# Patient Record
Sex: Male | Born: 1985
Health system: Southern US, Community
[De-identification: ages and names within clinical notes are randomized; demographics above are authoritative.]

## PROBLEM LIST (undated history)

## (undated) DIAGNOSIS — S62339A Displaced fracture of neck of unspecified metacarpal bone, initial encounter for closed fracture: Secondary | ICD-10-CM

## (undated) DIAGNOSIS — J3489 Other specified disorders of nose and nasal sinuses: Secondary | ICD-10-CM

## (undated) HISTORY — PX: NO PAST SURGERIES: SHX2092

---
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
## 2007-09-13 ENCOUNTER — Emergency Department (HOSPITAL_COMMUNITY): Admission: EM | Admit: 2007-09-13 | Discharge: 2007-09-13 | Payer: Self-pay | Admitting: Emergency Medicine

## 2010-08-13 ENCOUNTER — Inpatient Hospital Stay (INDEPENDENT_AMBULATORY_CARE_PROVIDER_SITE_OTHER)
Admission: RE | Admit: 2010-08-13 | Discharge: 2010-08-13 | Disposition: A | Discharge status: Home / Self Care | Payer: Self-pay | Source: Ambulatory Visit | Attending: Emergency Medicine | Admitting: Emergency Medicine

## 2010-08-13 DIAGNOSIS — R112 Nausea with vomiting, unspecified: Secondary | ICD-10-CM

## 2010-12-23 LAB — CBC
HCT: 47
Platelets: 265
RBC: 5.23
WBC: 12.8 — ABNORMAL HIGH

## 2010-12-23 LAB — RAPID URINE DRUG SCREEN, HOSP PERFORMED
Amphetamines: NOT DETECTED
Barbiturates: NOT DETECTED
Benzodiazepines: NOT DETECTED
Opiates: NOT DETECTED
Tetrahydrocannabinol: NOT DETECTED

## 2010-12-23 LAB — BASIC METABOLIC PANEL
BUN: 7
Chloride: 106
Creatinine, Ser: 1.14
GFR calc Af Amer: >60
GFR calc non Af Amer: >60
Potassium: 3.7

## 2010-12-23 LAB — DIFFERENTIAL
Lymphocytes Relative: 13
Lymphs Abs: 1.6
Monocytes Relative: 2 — ABNORMAL LOW
Neutrophils Relative %: 85 — ABNORMAL HIGH

## 2010-12-23 LAB — ETHANOL: Alcohol, Ethyl (B): 73 — ABNORMAL HIGH

## 2011-07-25 ENCOUNTER — Emergency Department (INDEPENDENT_AMBULATORY_CARE_PROVIDER_SITE_OTHER)
Admission: EM | Admit: 2011-07-25 | Discharge: 2011-07-25 | Disposition: A | Discharge status: Home Health | Payer: Self-pay | Source: Home / Self Care | Attending: Family Medicine | Admitting: Family Medicine

## 2011-07-25 ENCOUNTER — Encounter (HOSPITAL_COMMUNITY): Payer: Self-pay

## 2011-07-25 DIAGNOSIS — M539 Dorsopathy, unspecified: Secondary | ICD-10-CM

## 2011-07-25 DIAGNOSIS — M62830 Muscle spasm of back: Secondary | ICD-10-CM

## 2011-07-25 MED ORDER — NAPROXEN 500 MG PO TABS: 500.0000 mg | ORAL_TABLET | Freq: Two times a day (BID) | ORAL | Status: AC

## 2011-07-25 MED ORDER — KETOROLAC TROMETHAMINE 60 MG/2ML IM SOLN
60.0000 mg | Freq: Once | INTRAMUSCULAR | Status: AC
  Administered 2011-07-25: 60 mg via INTRAMUSCULAR

## 2011-07-25 MED ORDER — DIAZEPAM 5 MG PO TABS: 5.0000 mg | ORAL_TABLET | Freq: Three times a day (TID) | ORAL | Status: AC | PRN

## 2011-07-25 MED ORDER — PROMETHAZINE-DM 6.25-15 MG/5ML PO SYRP: 5.0000 mL | ORAL_SOLUTION | Freq: Four times a day (QID) | ORAL | Status: AC | PRN

## 2011-07-25 MED ORDER — HYDROCODONE-ACETAMINOPHEN 5-325 MG PO TABS
ORAL_TABLET | ORAL | Status: AC
  Filled 2011-07-25: qty 1

## 2011-07-25 MED ORDER — KETOROLAC TROMETHAMINE 60 MG/2ML IM SOLN
INTRAMUSCULAR | Status: AC
  Filled 2011-07-25: qty 2

## 2011-07-25 MED ORDER — HYDROCODONE-ACETAMINOPHEN 5-325 MG PO TABS
1.0000 | ORAL_TABLET | Freq: Once | ORAL | Status: AC
  Administered 2011-07-25: 1 via ORAL

## 2011-07-25 NOTE — ED Notes (Signed)
Pt c/o L lower back pain onset this weekend.  Pt states he was at a "get together", he twisted and fell to floor.  Pt states he has been using Tylenol, Crista Elliot and Kahi Mohala with no relief.
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
## 2011-07-25 NOTE — Discharge Instructions (Signed)
Take the prescribed medications as directed. Take the naproxen as directed, with meals to avoid stomach irritation, daily for baseline pain relief. Use the diazepam (VALIUM) ONLY AS NEEDED and AS DIRECTED, for muscle spasm. Do not mix with alcohol or any other sedating drug. Use mild heat (heating pad, warm baths, etc) for 10 to 15 minutes, two to three times daily, as needed and as tolerated, taking care to not burn the skin. Begin stretches and exercises, as instructed in handouts, after 48 hours. Return to care should your symptoms not improve, or worsen in any way, such as numbness, weakness, or tingling, or inability to control urine or bowel movements.

## 2014-08-23 ENCOUNTER — Encounter (HOSPITAL_COMMUNITY): Payer: Self-pay | Admitting: Emergency Medicine

## 2014-08-23 ENCOUNTER — Emergency Department (HOSPITAL_COMMUNITY)
Admission: EM | Admit: 2014-08-23 | Discharge: 2014-08-23 | Disposition: A | Discharge status: Home / Self Care | Payer: Self-pay | Attending: Emergency Medicine | Admitting: Emergency Medicine

## 2014-08-23 ENCOUNTER — Emergency Department (HOSPITAL_COMMUNITY): Payer: Self-pay

## 2014-08-23 DIAGNOSIS — R05 Cough: Secondary | ICD-10-CM | POA: Insufficient documentation

## 2014-08-23 DIAGNOSIS — R059 Cough, unspecified: Secondary | ICD-10-CM

## 2014-08-23 DIAGNOSIS — Z720 Tobacco use: Secondary | ICD-10-CM | POA: Insufficient documentation

## 2014-08-23 DIAGNOSIS — J3489 Other specified disorders of nose and nasal sinuses: Secondary | ICD-10-CM | POA: Insufficient documentation

## 2014-08-23 DIAGNOSIS — R0981 Nasal congestion: Secondary | ICD-10-CM | POA: Insufficient documentation

## 2014-08-23 DIAGNOSIS — M791 Myalgia: Secondary | ICD-10-CM | POA: Insufficient documentation

## 2014-08-23 MED ORDER — PROMETHAZINE-CODEINE 6.25-10 MG/5ML PO SYRP: 5.0000 mL | ORAL_SOLUTION | Freq: Four times a day (QID) | ORAL | Status: DC | PRN

## 2014-08-23 NOTE — Discharge Instructions (Signed)
Cough, Adult  A cough is a reflex that helps clear your throat and airways. It can help heal the body or may be a reaction to an irritated airway. A cough may only last 2 or 3 weeks (acute) or may last more than 8 weeks (chronic).  CAUSES Acute cough:  Viral or bacterial infections. Chronic cough:  Infections.  Allergies.  Asthma.  Post-nasal drip.  Smoking.  Heartburn or acid reflux.  Some medicines.  Chronic lung problems (COPD).  Cancer. SYMPTOMS   Cough.  Fever.  Chest pain.  Increased breathing rate.  High-pitched whistling sound when breathing (wheezing).  Colored mucus that you cough up (sputum). TREATMENT   A bacterial cough may be treated with antibiotic medicine.  A viral cough must run its course and will not respond to antibiotics.  Your caregiver may recommend other treatments if you have a chronic cough. HOME CARE INSTRUCTIONS   Only take over-the-counter or prescription medicines for pain, discomfort, or fever as directed by your caregiver. Use cough suppressants only as directed by your caregiver.  Use a cold steam vaporizer or humidifier in your bedroom or home to help loosen secretions.  Sleep in a semi-upright position if your cough is worse at night.  Rest as needed.  Stop smoking if you smoke. SEEK IMMEDIATE MEDICAL CARE IF:   You have pus in your sputum.  Your cough starts to worsen.  You cannot control your cough with suppressants and are losing sleep.  You begin coughing up blood.  You have difficulty breathing.  You develop pain which is getting worse or is uncontrolled with medicine.  You have a fever. MAKE SURE YOU:   Understand these instructions.  Will watch your condition.  Will get help right away if you are not doing well or get worse. Document Released: 09/10/2010 Document Revised: 06/06/2011 Document Reviewed: 09/10/2010 [facility] Patient Information 2015 [place], [place]. This information is not intended  to replace advice given to you by your health care provider. Make sure you discuss any questions you have with your health care provider.    Cervical Sprain A cervical sprain is an injury in the neck in which the strong, fibrous tissues (ligaments) that connect your neck bones stretch or tear. Cervical sprains can range from mild to severe. Severe cervical sprains can cause the neck vertebrae to be unstable. This can lead to damage of the spinal cord and can result in serious nervous system problems. The amount of time it takes for a cervical sprain to get better depends on the cause and extent of the injury. Most cervical sprains heal in 1 to 3 weeks. CAUSES  Severe cervical sprains may be caused by:   Contact sport injuries (such as from football, rugby, wrestling, hockey, auto racing, gymnastics, diving, martial arts, or boxing).   Motor vehicle collisions.   Whiplash injuries. This is an injury from a sudden forward and backward whipping movement of the head and neck.  Falls.  Mild cervical sprains may be caused by:   Being in an awkward position, such as while cradling a telephone between your ear and shoulder.   Sitting in a chair that does not offer proper support.   Working at a poorly Marketing executive station.   Looking up or down for long periods of time.  SYMPTOMS   Pain, soreness, stiffness, or a burning sensation in the front, back, or sides of the neck. This discomfort may develop immediately after the injury or slowly, 24 hours or more  after the injury.   Pain or tenderness directly in the middle of the back of the neck.   Shoulder or upper back pain.   Limited ability to move the neck.   Headache.   Dizziness.   Weakness, numbness, or tingling in the hands or arms.   Muscle spasms.   Difficulty swallowing or chewing.   Tenderness and swelling of the neck.  DIAGNOSIS  Most of the time your health care provider can diagnose a cervical  sprain by taking your history and doing a physical exam. Your health care provider will ask about previous neck injuries and any known neck problems, such as arthritis in the neck. X-rays may be taken to find out if there are any other problems, such as with the bones of the neck. Other tests, such as a CT scan or MRI, may also be needed.  TREATMENT  Treatment depends on the severity of the cervical sprain. Mild sprains can be treated with rest, keeping the neck in place (immobilization), and pain medicines. Severe cervical sprains are immediately immobilized. Further treatment is done to help with pain, muscle spasms, and other symptoms and may include:  Medicines, such as pain relievers, numbing medicines, or muscle relaxants.   Physical therapy. This may involve stretching exercises, strengthening exercises, and posture training. Exercises and improved posture can help stabilize the neck, strengthen muscles, and help stop symptoms from returning.  HOME CARE INSTRUCTIONS   Put ice on the injured area.   Put ice in a plastic bag.   Place a towel between your skin and the bag.   Leave the ice on for 15-20 minutes, 3-4 times a day.   If your injury was severe, you may have been given a cervical collar to wear. A cervical collar is a two-piece collar designed to keep your neck from moving while it heals.  Do not remove the collar unless instructed by your health care provider.  If you have long hair, keep it outside of the collar.  Ask your health care provider before making any adjustments to your collar. Minor adjustments may be required over time to improve comfort and reduce pressure on your chin or on the back of your head.  Ifyou are allowed to remove the collar for cleaning or bathing, follow your health care provider's instructions on how to do so safely.  Keep your collar clean by wiping it with mild soap and water and drying it completely. If the collar you have been given  includes removable pads, remove them every 1-2 days and hand wash them with soap and water. Allow them to air dry. They should be completely dry before you wear them in the collar.  If you are allowed to remove the collar for cleaning and bathing, wash and dry the skin of your neck. Check your skin for irritation or sores. If you see any, tell your health care provider.  Do not drive while wearing the collar.   Only take over-the-counter or prescription medicines for pain, discomfort, or fever as directed by your health care provider.   Keep all follow-up appointments as directed by your health care provider.   Keep all physical therapy appointments as directed by your health care provider.   Make any needed adjustments to your workstation to promote good posture.   Avoid positions and activities that make your symptoms worse.   Warm up and stretch before being active to help prevent problems.  SEEK MEDICAL CARE IF:   Your pain  is not controlled with medicine.   You are unable to decrease your pain medicine over time as planned.   Your activity level is not improving as expected.  SEEK IMMEDIATE MEDICAL CARE IF:   You develop any bleeding.  You develop stomach upset.  You have signs of an allergic reaction to your medicine.   Your symptoms get worse.   You develop new, unexplained symptoms.   You have numbness, tingling, weakness, or paralysis in any part of your body.  MAKE SURE YOU:   Understand these instructions.  Will watch your condition.  Will get help right away if you are not doing well or get worse. Document Released: 01/09/2007 Document Revised: 03/19/2013 Document Reviewed: 09/19/2012 [facility] Patient Information 2015 [place], [place]. This information is not intended to replace advice given to you by your health care provider. Make sure you discuss any questions you have with your health care provider.

## 2014-08-23 NOTE — ED Provider Notes (Signed)
CSN: [id number]     Arrival date & time 08/23/14  0607 History   First MD Initiated Contact with Patient 08/23/14 [phone]     Chief Complaint  Patient presents with  . Shortness of Breath     (Consider location/radiation/quality/duration/timing/severity/associated sxs/prior Treatment) HPI  29 year old male presents with a cough for over one week. He states his breathing is "heavy". He has a history of "chronic bronchitis" the states his previous PCP treated him with Phenergan with codeine syrup. He states he last had some over a week ago. He chronically gets recurrent rhinorrhea and mixed dry and productive cough and states that usually this cough medicine is nothing that helps. He denies chest pain. Patient states that he lost his insurance and is unable to follow up with his doctor anymore. He was having significant cough last night and can sleep so his mom recommended he come to the ER. No chest pain. The patient is a current smoker. The patient also states he is having a crick in the left side of his neck after sleeping on it wrong 2 days ago. Has tried topical medicine and ibuprofen without any relief.  History reviewed. No pertinent past medical history. History reviewed. No pertinent past surgical history. History reviewed. No pertinent family history. History  Substance Use Topics  . Smoking status: Current Some Day Smoker    Types: Cigarettes  . Smokeless tobacco: Not on file  . Alcohol Use: Yes    Review of Systems  Constitutional: Negative for fever.  HENT: Positive for congestion and rhinorrhea.   Respiratory: Positive for cough and shortness of breath.   Cardiovascular: Negative for chest pain.  Gastrointestinal: Negative for vomiting.  Musculoskeletal: Positive for myalgias.  All other systems reviewed and are negative.     Allergies  Review of patient's allergies indicates no known allergies.  Home Medications   Prior to Admission medications   Medication Sig  Start Date End Date Taking? Authorizing Provider  promethazine-codeine (PHENERGAN WITH CODEINE) 6.25-10 MG/5ML syrup Take 5 mLs by mouth every 6 (six) hours as needed for cough.   Yes Historical Provider, MD   BP 127/80 mmHg  Pulse 77  Temp(Src) 98.3 F (36.8 C) (Oral)  Resp 22  Ht  (1.702 m)  Wt 170 lb (77.111 kg)  BMI 26.62 kg/m2  SpO2 100% Physical Exam  Constitutional: He is oriented to person, place, and time. He appears well-developed and well-nourished.  HENT:  Head: Normocephalic and atraumatic.  Right Ear: External ear normal.  Left Ear: External ear normal.  Nose: Nose normal.  Eyes: Right eye exhibits no discharge. Left eye exhibits no discharge.  Neck: Normal range of motion. Neck supple. Muscular tenderness (mild) present. No spinous process tenderness present. No rigidity.    Cardiovascular: Normal rate, regular rhythm, normal heart sounds and intact distal pulses.   Pulmonary/Chest: Effort normal and breath sounds normal. He has no wheezes. He has no rales.  Abdominal: Soft. There is no tenderness.  Musculoskeletal: He exhibits no edema.  Neurological: He is alert and oriented to person, place, and time.  Skin: Skin is warm and dry.  Nursing note and vitals reviewed.   ED Course  Procedures (including critical care time) Labs Review Labs Reviewed - No data to display  Imaging Review Dg Chest 2 View  08/23/2014   CLINICAL DATA:  Shortness of breath and left-sided neck pain  EXAM: CHEST  2 VIEW  COMPARISON:  None.  FINDINGS: The heart size and mediastinal contours  are within normal limits. Both lungs are clear. The visualized skeletal structures are unremarkable.  IMPRESSION: No active cardiopulmonary disease.   Electronically Signed   By: Christiana PellantGretchen  Green M.D.   On: 08/23/2014 08:07     EKG Interpretation None      MDM   Final diagnoses:  Cough    Patient is well-appearing and in no acute distress. Vital signs are unremarkable and he has a normal  physical exam at this time. Chest x-ray is normal, have extremely low suspicion for other serious diagnosis such as pulmonary embolism causing cough. His neck pain appears to be musculoskeletal. Will give small amount of his cough syrup but have discussed the importance of following up with a primary care physician given his chronic cough. Discussed need to stop smoking as well.    Pricilla LovelessScott Gaelyn Tukes, MD 08/23/14 [phone]

## 2014-08-23 NOTE — ED Notes (Signed)
Pt arrived to the ED with a complaint of shortness of breath.  Pt states he has chronic bronchitis.  Pt has had this condition for four years.  Pt states today he felt that he was worsening.  Pt states he was on medications previously but has not seen his doctor in a while.

## 2014-12-04 ENCOUNTER — Emergency Department (HOSPITAL_COMMUNITY)
Admission: EM | Admit: 2014-12-04 | Discharge: 2014-12-04 | Disposition: A | Discharge status: Home / Self Care | Payer: Self-pay | Attending: Emergency Medicine | Admitting: Emergency Medicine

## 2014-12-04 ENCOUNTER — Ambulatory Visit: Payer: Self-pay

## 2014-12-04 ENCOUNTER — Encounter (HOSPITAL_COMMUNITY): Payer: Self-pay | Admitting: Emergency Medicine

## 2014-12-04 ENCOUNTER — Emergency Department (HOSPITAL_COMMUNITY)
Admission: EM | Admit: 2014-12-04 | Discharge: 2014-12-04 | Discharge status: Against Medical Advice | Payer: Self-pay | Attending: Emergency Medicine | Admitting: Emergency Medicine

## 2014-12-04 DIAGNOSIS — Z8709 Personal history of other diseases of the respiratory system: Secondary | ICD-10-CM | POA: Insufficient documentation

## 2014-12-04 DIAGNOSIS — R05 Cough: Secondary | ICD-10-CM | POA: Insufficient documentation

## 2014-12-04 DIAGNOSIS — Z7289 Other problems related to lifestyle: Secondary | ICD-10-CM | POA: Insufficient documentation

## 2014-12-04 DIAGNOSIS — Z765 Malingerer [conscious simulation]: Secondary | ICD-10-CM

## 2014-12-04 DIAGNOSIS — R059 Cough, unspecified: Secondary | ICD-10-CM

## 2014-12-04 DIAGNOSIS — J029 Acute pharyngitis, unspecified: Secondary | ICD-10-CM | POA: Insufficient documentation

## 2014-12-04 DIAGNOSIS — Z720 Tobacco use: Secondary | ICD-10-CM | POA: Insufficient documentation

## 2014-12-04 MED ORDER — PROMETHAZINE-CODEINE 6.25-10 MG/5ML PO SYRP: 5.0000 mL | ORAL_SOLUTION | Freq: Four times a day (QID) | ORAL | Status: DC | PRN

## 2014-12-04 NOTE — ED Provider Notes (Signed)
CSN: [id number]     Arrival date & time 12/04/14  0935 History  This chart was scribed for non-physician practitioner, Burna Forts, PA-C working with No att. providers found by Angelene Giovanni, ED Scribe. The patient was seen in room TR11C/TR11C and the patient's care was started at 10:50 AM    Chief Complaint  Patient presents with  . Cough   The history is provided by the patient. No language interpreter was used.   HPI Comments:  Drug-seeking behavior   Jeff Manning is a 29 y.o. male with a hx of Bronchitis who presents to the Emergency Department complaining of gradually worsening cough that is worse at night onset 3 days ago. He reports associated rhinorrhea, watery eyes, and mild sore throat. He denies any leg swelling or a hx of PE or DVT. He states that he is a smoker. He denies any allergies. He reports that he has not been able to go to work for the past 3 days because he has not been able to sleep at night. He states that he was seen by his PCP, Dr. Donnie Coffin on [facility] yesterday and he told him that there is Phlegm building up in his chest after his chest X-ray but he would not prescribe him anything since he did not have his medicaid card. He adds that normally he is prescribed Phenergan and he takes it at night. He states that his PCP asked him to come to the ER to be able to be seen and prescribed medication despite not having his card. 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    Past Medical History  Diagnosis Date  . Bronchitis    History reviewed. No pertinent past surgical history. History reviewed. No pertinent family history. Social History  Substance Use Topics  . Smoking status: Current Some Day Smoker    Types: Cigarettes  . Smokeless tobacco: None  . Alcohol Use: Yes    Review of Systems  All other systems reviewed and are negative.   Allergies  Review of patient's allergies indicates no known allergies.  Home Medications   Prior to Admission  medications   Medication Sig Start Date End Date Taking? Authorizing Provider  promethazine-codeine (PHENERGAN WITH CODEINE) 6.25-10 MG/5ML syrup Take 5 mLs by mouth every 6 (six) hours as needed for cough. 12/04/14   Ambrose Finland Little, MD   BP 114/82 mmHg  Pulse 65  Temp(Src) 98.2 F (36.8 C) (Oral)  Resp 18  Ht 5\' 6"  (1.676 m)  Wt 185 lb (83.915 kg)  BMI 29.87 kg/m2  SpO2 99%   Physical Exam  Constitutional: He is oriented to person, place, and time. He appears well-developed and well-nourished.  HENT:  Head: Normocephalic and atraumatic.  Cardiovascular: Normal rate and regular rhythm.   Pulmonary/Chest: Effort normal. No respiratory distress. He has no wheezes. He has no rales. He exhibits no tenderness.  Abdominal: He exhibits no distension. There is no tenderness.  Musculoskeletal: Normal range of motion. He exhibits no edema or tenderness.  Neurological: He is alert and oriented to person, place, and time.  Skin: Skin is warm and dry.  Psychiatric: He has a normal mood and affect.  Nursing note and vitals reviewed.   ED Course  Procedures (including critical care time) DIAGNOSTIC STUDIES: Oxygen Saturation is 99% on RA, normal by my interpretation.    COORDINATION OF CARE: 10:58 AM- Pt advised of plan for treatment and pt agrees.    Labs Review Labs  Reviewed - No data to display  Imaging Review No results found. I have personally reviewed and evaluated these images and lab results as part of my medical decision-making.   EKG Interpretation None      MDM   Final diagnoses:  Drug-seeking behavior   Drug-seeking   Labs:  Imaging:  Consults:  Therapeutics:  Discharge Meds:   Patient presented to the emergency room with complaints of "chronic bronchitis". He reports that he saw his primary care provider yesterday who was unwilling to prescribe his cough medication due to him not having a Medicaid card. Patient reports that it is chest x-ray with no  significant findings. Patient reports the symptoms come and go, he has difficulty sleeping, has not been able to work for the last 3 days. After evaluating the patient and finding no findings on my exam I informed him that he does not need any medications at this time, and that he needs to contact his primary care provider if they instructed him to have cough medication. I went and reviewed patient's chart and saw that he was seen in the emergency room today at [facility] was prescribed cough medicine with codeine. I asked the patient about this encounter, he admitted to being seen there and reported that "they told me to come to the emergency room here". I told the patient this is highly unlikely as I read the note and third no indications for you to return to the emergency room, patient was unable to tell me why his discomfort emergency room. Patient asked to the bathroom, after the bathroom he eloped from the facility. Obviously drug-seeking behavior.   I personally performed the services described in this documentation, which was scribed in my presence. The recorded information has been reviewed and is accurate.        Eyvonne Mechanic, PA-C 12/04/14 1728  Bethann Berkshire, MD 12/05/14 [phone]

## 2014-12-04 NOTE — ED Notes (Addendum)
Pt states he usually get bronchitis once a year when he gets a cold and is having trouble sleeping at night; occasional smoker. Hx of such and called MD and he was px phenergan to be able to sleep at night. Chest pain centralized when coughs, deep inspiration, crosses arms. States always has chest pain when sick with bronchitis.

## 2014-12-04 NOTE — ED Provider Notes (Signed)
CSN: [id number]     Arrival date & time 12/04/14  9562 History   First MD Initiated Contact with Patient 12/04/14 0654     Chief Complaint  Patient presents with  . Bronchitis     (Consider location/radiation/quality/duration/timing/severity/associated sxs/prior Treatment) HPI Comments: 29yo healthy M who presents with cough. The patient states that 11/2-2 weeks ago, he developed cold-like symptoms including cough, sore throat, runny nose. After several days old his symptoms resolved with the exception of his cough, which is persistent and worse at night when he is trying to sleep. He states that he gets bronchitis approximately once a year and tried to see his PCP to get cough suppressant medication but was unable to locate his Medicaid card. He presents this morning requesting refill of this medication. He has some chest pain when he coughs but denies any chest pain at rest. No shortness of breath. No vomiting, diarrhea, or abdominal pain.  The history is provided by the patient.    Past Medical History  Diagnosis Date  . Bronchitis    History reviewed. No pertinent past surgical history. No family history on file. Social History  Substance Use Topics  . Smoking status: Current Some Day Smoker    Types: Cigarettes  . Smokeless tobacco: None  . Alcohol Use: Yes    Review of Systems  10 Systems reviewed and are negative for acute change except as noted in the HPI.   Allergies  Review of patient's allergies indicates no known allergies.  Home Medications   Prior to Admission medications   Medication Sig Start Date End Date Taking? Authorizing Provider  promethazine-codeine (PHENERGAN WITH CODEINE) 6.25-10 MG/5ML syrup Take 5 mLs by mouth every 6 (six) hours as needed for cough. 12/04/14   Ambrose Finland Little, MD   BP 135/109 mmHg  Pulse 59  Temp(Src) 97.8 F (36.6 C) (Oral)  Resp 18  Ht  (1.676 m)  Wt 185 lb (83.915 kg)  BMI 29.87 kg/m2  SpO2 99% Physical Exam   Constitutional: He is oriented to person, place, and time. He appears well-developed and well-nourished. No distress.  HENT:  Head: Normocephalic and atraumatic.  Mouth/Throat: Oropharynx is clear and moist.  Moist mucous membranes; large tonsils  Eyes: Conjunctivae are normal. Pupils are equal, round, and reactive to light.  Neck: Neck supple.  Cardiovascular: Normal rate, regular rhythm and normal heart sounds.   No murmur heard. Pulmonary/Chest: Effort normal and breath sounds normal. No respiratory distress. He has no wheezes.  Occasional cough w/ deep inspiration  Abdominal: Soft. Bowel sounds are normal. He exhibits no distension. There is no tenderness.  Musculoskeletal: He exhibits no edema.  Neurological: He is alert and oriented to person, place, and time.  Fluent speech  Skin: Skin is warm and dry.  Psychiatric: He has a normal mood and affect. Judgment normal.  Nursing note and vitals reviewed.   ED Course  Procedures (including critical care time) Labs Review Labs Reviewed - No data to display   MDM   Final diagnoses:  Cough   29 year old male who presents with several weeks of cough after cold-like illness. Patient well-appearing with normal vital signs at presentation. No reports of fever or shortness of breath therefore I do not feel that he needs any imaging at this time. He reports a chronic history of enlarged tonsils and denies any sore throat currently. I have instructed him to follow-up with PCP for ENT referral as needed. Regarding cough, provided phenergan w/ codeine for  use at night and cautioned on any use during day while working, driving etc. Return precautions including fever, SOB reviewed and patient voiced understanding. Pt discharged in satisfactory condition.  Laurence Spates, MD 12/04/14 [phone]

## 2014-12-04 NOTE — ED Notes (Signed)
Pt c/o cough, difficulty breathing at night. Pt states he gets bronchitis every year at this time and is given promethazine syrup

## 2014-12-04 NOTE — Discharge Instructions (Signed)
Cough, Adult  A cough is a reflex that helps clear your throat and airways. It can help heal the body or may be a reaction to an irritated airway. A cough may only last 2 or 3 weeks (acute) or may last more than 8 weeks (chronic).  CAUSES Acute cough:  Viral or bacterial infections. Chronic cough:  Infections.  Allergies.  Asthma.  Post-nasal drip.  Smoking.  Heartburn or acid reflux.  Some medicines.  Chronic lung problems (COPD).  Cancer. SYMPTOMS   Cough.  Fever.  Chest pain.  Increased breathing rate.  High-pitched whistling sound when breathing (wheezing).  Colored mucus that you cough up (sputum). TREATMENT   A bacterial cough may be treated with antibiotic medicine.  A viral cough must run its course and will not respond to antibiotics.  Your caregiver may recommend other treatments if you have a chronic cough. HOME CARE INSTRUCTIONS   Only take over-the-counter or prescription medicines for pain, discomfort, or fever as directed by your caregiver. Use cough suppressants only as directed by your caregiver.  Use a cold steam vaporizer or humidifier in your bedroom or home to help loosen secretions.  Sleep in a semi-upright position if your cough is worse at night.  Rest as needed.  Stop smoking if you smoke. SEEK IMMEDIATE MEDICAL CARE IF:   You have pus in your sputum.  Your cough starts to worsen.  You cannot control your cough with suppressants and are losing sleep.  You begin coughing up blood.  You have difficulty breathing.  You develop pain which is getting worse or is uncontrolled with medicine.  You have a fever. MAKE SURE YOU:   Understand these instructions.  Will watch your condition.  Will get help right away if you are not doing well or get worse. Document Released: 09/10/2010 Document Revised: 06/06/2011 Document Reviewed: 09/10/2010 Exit[employer] Patient Information 2015 [employer]. This information is not intended  to replace advice given to you by your health care provider. Make sure you discuss any questions you have with your health care provider.  

## 2014-12-04 NOTE — ED Notes (Signed)
PT reports a fever at night . Pt also reports he takes Phenergan for cough. Pt reports his Med coverage  And can not see his PCP.

## 2015-03-11 ENCOUNTER — Encounter (HOSPITAL_BASED_OUTPATIENT_CLINIC_OR_DEPARTMENT_OTHER): Payer: Self-pay

## 2015-03-11 ENCOUNTER — Emergency Department (HOSPITAL_BASED_OUTPATIENT_CLINIC_OR_DEPARTMENT_OTHER): Payer: Self-pay

## 2015-03-11 ENCOUNTER — Emergency Department (HOSPITAL_BASED_OUTPATIENT_CLINIC_OR_DEPARTMENT_OTHER)
Admission: EM | Admit: 2015-03-11 | Discharge: 2015-03-11 | Disposition: A | Discharge status: Home / Self Care | Payer: Self-pay | Attending: Emergency Medicine | Admitting: Emergency Medicine

## 2015-03-11 DIAGNOSIS — R0602 Shortness of breath: Secondary | ICD-10-CM

## 2015-03-11 DIAGNOSIS — J069 Acute upper respiratory infection, unspecified: Secondary | ICD-10-CM | POA: Insufficient documentation

## 2015-03-11 DIAGNOSIS — F17210 Nicotine dependence, cigarettes, uncomplicated: Secondary | ICD-10-CM | POA: Insufficient documentation

## 2015-03-11 LAB — RAPID STREP SCREEN (MED CTR MEBANE ONLY): Streptococcus, Group A Screen (Direct): NEGATIVE

## 2015-03-11 MED ORDER — ALBUTEROL SULFATE HFA 108 (90 BASE) MCG/ACT IN AERS: 2.0000 | INHALATION_SPRAY | RESPIRATORY_TRACT | Status: DC | PRN

## 2015-03-11 MED ORDER — ALBUTEROL SULFATE HFA 108 (90 BASE) MCG/ACT IN AERS
2.0000 | INHALATION_SPRAY | Freq: Once | RESPIRATORY_TRACT | Status: AC
  Administered 2015-03-11: 2 via RESPIRATORY_TRACT

## 2015-03-11 MED ORDER — ALBUTEROL SULFATE HFA 108 (90 BASE) MCG/ACT IN AERS
INHALATION_SPRAY | RESPIRATORY_TRACT | Status: AC
  Filled 2015-03-11: qty 6.7

## 2015-03-11 MED ORDER — BENZONATATE 100 MG PO CAPS: 100.0000 mg | ORAL_CAPSULE | Freq: Three times a day (TID) | ORAL | Status: DC

## 2015-03-11 NOTE — Discharge Instructions (Signed)

## 2015-03-11 NOTE — ED Provider Notes (Signed)
CSN: [id number]     Arrival date & time 03/11/15  0131 History   None    Chief Complaint  Patient presents with  . Nasal Congestion     (Consider location/radiation/quality/duration/timing/severity/associated sxs/prior Treatment) HPI  This is a 29 year old male who presents with "cold symptoms." Patient reports that his "bronchitis" is coming back. He is a former smoker. Reports history of recurrent bronchitis. Reports fever of 101 this a.m. Reports congestion and cough productive of sputum. Reports associated shortness of breath. Denies chest pain. Denies nausea, vomiting, and diarrhea. Has not taken anything for his symptoms. Does report sick contacts.  Past Medical History  Diagnosis Date  . Bronchitis    History reviewed. No pertinent past surgical history. History reviewed. No pertinent family history. Social History  Substance Use Topics  . Smoking status: Current Some Day Smoker    Types: Cigarettes  . Smokeless tobacco: None  . Alcohol Use: Yes    Review of Systems  Constitutional: Positive for fever.  HENT: Positive for congestion.   Respiratory: Positive for cough and shortness of breath. Negative for chest tightness.   Cardiovascular: Negative.  Negative for chest pain.  Gastrointestinal: Negative.  Negative for nausea, vomiting, abdominal pain and diarrhea.  Genitourinary: Negative.  Negative for dysuria.  Skin: Negative for rash.  Neurological: Negative for headaches.  All other systems reviewed and are negative.     Allergies  Review of patient's allergies indicates no known allergies.  Home Medications   Prior to Admission medications   Medication Sig Start Date End Date Taking? Authorizing Provider  benzonatate (TESSALON) 100 MG capsule Take 1 capsule (100 mg total) by mouth every 8 (eight) hours. 03/11/15   Shon Batonourtney F Horton, MD  promethazine-codeine (PHENERGAN WITH CODEINE) 6.25-10 MG/5ML syrup Take 5 mLs by mouth every 6 (six) hours as needed for  cough. 12/04/14   Ambrose Finlandachel Morgan Little, MD   BP 132/75 mmHg  Pulse 75  Temp(Src) 98.5 F (36.9 C) (Oral)  Resp 16  Ht 5\' 9"  (1.753 m)  Wt 180 lb (81.647 kg)  BMI 26.57 kg/m2  SpO2 98% Physical Exam  Constitutional: He is oriented to person, place, and time. He appears well-developed and well-nourished. No distress.  HENT:  Head: Normocephalic and atraumatic.  Mouth/Throat: Oropharynx is clear and moist.  Bilaterally enlarged tonsils with craters noted in the left tonsil, erythema, no exudate  Eyes: Pupils are equal, round, and reactive to light.  Neck: Neck supple.  Cardiovascular: Normal rate, regular rhythm and normal heart sounds.   No murmur heard. Pulmonary/Chest: Effort normal and breath sounds normal. No respiratory distress. He has no wheezes.  Abdominal: Soft. Bowel sounds are normal. There is no tenderness. There is no rebound.  Musculoskeletal: He exhibits no edema.  Lymphadenopathy:    He has cervical adenopathy.  Neurological: He is alert and oriented to person, place, and time.  Skin: Skin is warm and dry.  Psychiatric: He has a normal mood and affect.  Nursing note and vitals reviewed.   ED Course  Procedures (including critical care time) Labs Review Labs Reviewed  RAPID STREP SCREEN (NOT AT [facility])  CULTURE, GROUP A STREP    Imaging Review Dg Chest 2 View  03/11/2015  CLINICAL DATA:  Difficulty breathing for 10 days. Nasal congestion, shortness of breath with cough. Ex-smoker for several months. EXAM: CHEST  2 VIEW COMPARISON:  08/23/2014 FINDINGS: Normal heart size and pulmonary vascularity. No focal airspace disease or consolidation in the lungs. No blunting of costophrenic  angles. No pneumothorax. Mediastinal contours appear intact. IMPRESSION: No active cardiopulmonary disease. Electronically Signed   By: Burman Nieves M.D.   On: 03/11/2015 03:03   I have personally reviewed and evaluated these images and lab results as part of my medical  decision-making.   EKG Interpretation None      MDM   Final diagnoses:  SOB (shortness of breath)  URI (upper respiratory infection)    Patient presents with cough, shortness of breath, nasal congestion. Nontoxic on exam. Afebrile. Given her reported fevers, chest x-ray obtained. Oropharynx exam with impressively enlarged tonsils and craters noted in the left tonsils. Given fever will also obtain strep. He has no wheezing and breath sounds are clear otherwise. Chest x-ray is negative and strep screen is also negative. Discuss with patient supportive care at home with [facility] and inhaler given his smoking history. Patient stated understanding. Patient needs to establish primary care as his cough appears chronic in nature and he has had multiple presentations for the same in the past.  After history, exam, and medical workup I feel the patient has been appropriately medically screened and is safe for discharge home. Pertinent diagnoses were discussed with the patient. Patient was given return precautions.     Shon Baton, MD 03/11/15 [phone]

## 2015-03-11 NOTE — ED Notes (Signed)
Pt reports nasal congestion, productive cough with brown sputum x1 week. Lungs clear to auscultation.

## 2015-03-13 LAB — CULTURE, GROUP A STREP

## 2015-04-05 ENCOUNTER — Encounter (HOSPITAL_COMMUNITY): Payer: Self-pay | Admitting: Emergency Medicine

## 2015-04-05 ENCOUNTER — Emergency Department (HOSPITAL_COMMUNITY)
Admission: EM | Admit: 2015-04-05 | Discharge: 2015-04-05 | Disposition: A | Discharge status: Home / Self Care | Payer: Self-pay | Attending: Emergency Medicine | Admitting: Emergency Medicine

## 2015-04-05 DIAGNOSIS — Z79899 Other long term (current) drug therapy: Secondary | ICD-10-CM | POA: Insufficient documentation

## 2015-04-05 DIAGNOSIS — R05 Cough: Secondary | ICD-10-CM | POA: Insufficient documentation

## 2015-04-05 DIAGNOSIS — R000 Tachycardia, unspecified: Secondary | ICD-10-CM | POA: Insufficient documentation

## 2015-04-05 DIAGNOSIS — F17210 Nicotine dependence, cigarettes, uncomplicated: Secondary | ICD-10-CM | POA: Insufficient documentation

## 2015-04-05 DIAGNOSIS — R51 Headache: Secondary | ICD-10-CM | POA: Insufficient documentation

## 2015-04-05 DIAGNOSIS — R059 Cough, unspecified: Secondary | ICD-10-CM

## 2015-04-05 DIAGNOSIS — R079 Chest pain, unspecified: Secondary | ICD-10-CM | POA: Insufficient documentation

## 2015-04-05 DIAGNOSIS — J029 Acute pharyngitis, unspecified: Secondary | ICD-10-CM | POA: Insufficient documentation

## 2015-04-05 MED ORDER — IBUPROFEN 100 MG/5ML PO SUSP: 600.0000 mg | Freq: Four times a day (QID) | ORAL | Status: DC | PRN

## 2015-04-05 MED ORDER — PREDNISONE 20 MG PO TABS: 60.0000 mg | ORAL_TABLET | Freq: Once | ORAL | Status: DC

## 2015-04-05 MED ORDER — KETOROLAC TROMETHAMINE 60 MG/2ML IM SOLN: 60.0000 mg | Freq: Once | INTRAMUSCULAR | Status: DC

## 2015-04-05 MED ORDER — PREDNISOLONE 15 MG/5ML PO SOLN
30.0000 mg | Freq: Once | ORAL | Status: AC
  Administered 2015-04-05: 30 mg via ORAL
  Filled 2015-04-05: qty 2

## 2015-04-05 MED ORDER — HYDROCODONE-HOMATROPINE 5-1.5 MG/5ML PO SYRP: 5.0000 mL | ORAL_SOLUTION | Freq: Every evening | ORAL | Status: DC | PRN

## 2015-04-05 MED ORDER — HYDROCODONE-HOMATROPINE 5-1.5 MG/5ML PO SYRP
5.0000 mL | ORAL_SOLUTION | Freq: Once | ORAL | Status: AC
  Administered 2015-04-05: 5 mL via ORAL
  Filled 2015-04-05: qty 5

## 2015-04-05 MED ORDER — IBUPROFEN 100 MG/5ML PO SUSP
600.0000 mg | Freq: Once | ORAL | Status: AC
  Administered 2015-04-05: 600 mg via ORAL
  Filled 2015-04-05: qty 30

## 2015-04-05 MED ORDER — PREDNISOLONE 15 MG/5ML PO SYRP: 30.0000 mg | ORAL_SOLUTION | Freq: Every day | ORAL | Status: AC

## 2015-04-05 NOTE — Discharge Instructions (Signed)

## 2015-04-05 NOTE — ED Notes (Signed)
Pt. Came in to ED with complaint of dry cough with headache at 7/10 xdays, denies fever. Take OTC med no relief.

## 2015-04-05 NOTE — ED Provider Notes (Signed)
CSN: [id number]     Arrival date & time 04/05/15  0127 History   First MD Initiated Contact with Patient 04/05/15 [phone]     Chief Complaint  Patient presents with  . Cough  . Headache     (Consider location/radiation/quality/duration/timing/severity/associated sxs/prior Treatment) Patient is a 30 y.o. male presenting with cough and headaches. The history is provided by the patient. No language interpreter was used.  Cough Cough characteristics:  Non-productive and dry Severity:  Moderate Onset quality:  Gradual Duration:  3 days Timing:  Intermittent Progression:  Waxing and waning Chronicity:  Recurrent Smoker: yes (some days)   Context: not sick contacts   Relieved by:  Nothing Worsened by:  Deep breathing Ineffective treatments: OTC medications. Associated symptoms: chest pain (when coughing), headaches (when coughing) and sore throat (when coughing)   Associated symptoms: no fever, no rhinorrhea, no shortness of breath, no sinus congestion and no wheezing   Risk factors: no recent travel   Headache Associated symptoms: cough and sore throat (when coughing)   Associated symptoms: no congestion, no fever, no nausea and no vomiting     Past Medical History  Diagnosis Date  . Bronchitis    History reviewed. No pertinent past surgical history. History reviewed. No pertinent family history. Social History  Substance Use Topics  . Smoking status: Current Some Day Smoker    Types: Cigarettes  . Smokeless tobacco: None  . Alcohol Use: Yes    Review of Systems  Constitutional: Negative for fever.  HENT: Positive for sore throat (when coughing). Negative for congestion and rhinorrhea.   Respiratory: Positive for cough. Negative for shortness of breath and wheezing.   Cardiovascular: Positive for chest pain (when coughing).  Gastrointestinal: Negative for nausea and vomiting.  Neurological: Positive for headaches (when coughing). Negative for syncope.  All other systems  reviewed and are negative.   Allergies  Review of patient's allergies indicates no known allergies.  Home Medications   Prior to Admission medications   Medication Sig Start Date End Date Taking? Authorizing Provider  benzonatate (TESSALON) 100 MG capsule Take 1 capsule (100 mg total) by mouth every 8 (eight) hours. 03/11/15   Shon Batonourtney F Horton, MD  HYDROcodone-homatropine (HYCODAN) 5-1.5 MG/5ML syrup Take 5 mLs by mouth at bedtime as needed for cough. 04/05/15   Antony MaduraKelly Rhealynn Myhre, PA-C  ibuprofen (CHILD IBUPROFEN) 100 MG/5ML suspension Take 30 mLs (600 mg total) by mouth every 6 (six) hours as needed for mild pain or moderate pain. 04/05/15   Antony MaduraKelly Rhyanna Sorce, PA-C  prednisoLONE (PRELONE) 15 MG/5ML syrup Take 10 mLs (30 mg total) by mouth daily. 04/05/15 04/10/15  Antony MaduraKelly Alyssamarie Mounsey, PA-C  promethazine-codeine (PHENERGAN WITH CODEINE) 6.25-10 MG/5ML syrup Take 5 mLs by mouth every 6 (six) hours as needed for cough. 12/04/14   Ambrose Finlandachel Morgan Little, MD   BP 145/59 mmHg  Pulse 109  Temp(Src) 98.6 F (37 C) (Oral)  Resp 21  SpO2 98%   Physical Exam  Constitutional: He is oriented to person, place, and time. He appears well-developed and well-nourished. No distress.  Nontoxic/nonseptic appearing  HENT:  Head: Normocephalic and atraumatic.  Eyes: Conjunctivae and EOM are normal. No scleral icterus.  Neck: Normal range of motion.  No nuchal rigidity or meningismus  Pulmonary/Chest: Effort normal and breath sounds normal. No respiratory distress. He has no wheezes. He has no rales.  Lungs clear bilaterally. Sporadic, forceful, dry cough appreciated at bedside. Chest expansion symmetric.  Musculoskeletal: Normal range of motion.  Neurological: He is alert and  oriented to person, place, and time. He exhibits normal muscle tone. Coordination normal.  GCS 15. Patient moving all extremities.  Skin: Skin is warm and dry. No rash noted. He is not diaphoretic. No erythema. No pallor.  Psychiatric: He has a normal mood  and affect. His behavior is normal.  Nursing note and vitals reviewed.   ED Course  Procedures (including critical care time) Labs Review Labs Reviewed  D-DIMER, QUANTITATIVE (NOT AT [facility])    Imaging Review No results found.   I have personally reviewed and evaluated these images and lab results as part of my medical decision-making.   EKG Interpretation None      MDM   Final diagnoses:  Cough  Tachycardia    30 year old male presents the emergency department for evaluation of a persistent dry cough. He has been evaluated in the emergency department multiple times for similar symptoms. Patient with clear breath sounds bilaterally. No hypoxia. No signs of respiratory distress such as tachypnea or dyspnea. Chest expansion symmetric. No fever to suggest pneumonia.  Patient noted to be tachycardic on arrival. Tachycardia slightly worsens with ambulation, the patient never experiences desaturation. I have recommended to the patient further workup with a d-dimer to evaluate for the possibility of pulmonary embolus as cause of cough with tachycardia as he has never been tachycardic in the past with these symptoms. The patient states that he is feeling better since receiving medications in the emergency department and he does not believe that he requires further testing. I have explained to the patient that he is able to leave the emergency department without further workup, but would need to do so AGAINST MEDICAL ADVICE. Patient verbalizes understanding. He has been advised to f/u with his PCP. Return precautions given prior to the patient's departure from the department.   Filed Vitals:   04/05/15 0147 04/05/15 0439 04/05/15 0441  BP: 127/89  145/59  Pulse: 109    Temp: 98.7 F (37.1 C)  98.6 F (37 C)  TempSrc: Oral    Resp: 21    SpO2: 100% 98%      Antony Madura, PA-C 04/05/15 0503  Gilda Crease, MD 04/06/15 [phone]

## 2015-06-16 ENCOUNTER — Emergency Department (HOSPITAL_BASED_OUTPATIENT_CLINIC_OR_DEPARTMENT_OTHER)
Admission: EM | Admit: 2015-06-16 | Discharge: 2015-06-16 | Disposition: A | Discharge status: Home / Self Care | Payer: Self-pay | Attending: Emergency Medicine | Admitting: Emergency Medicine

## 2015-06-16 ENCOUNTER — Encounter (HOSPITAL_BASED_OUTPATIENT_CLINIC_OR_DEPARTMENT_OTHER): Payer: Self-pay | Admitting: Emergency Medicine

## 2015-06-16 DIAGNOSIS — A64 Unspecified sexually transmitted disease: Secondary | ICD-10-CM | POA: Insufficient documentation

## 2015-06-16 DIAGNOSIS — F17210 Nicotine dependence, cigarettes, uncomplicated: Secondary | ICD-10-CM | POA: Insufficient documentation

## 2015-06-16 LAB — URINALYSIS, ROUTINE W REFLEX MICROSCOPIC
BILIRUBIN URINE: NEGATIVE
Glucose, UA: NEGATIVE mg/dL
Hgb urine dipstick: NEGATIVE
Ketones, ur: NEGATIVE mg/dL
Leukocytes, UA: NEGATIVE
NITRITE: NEGATIVE
Protein, ur: NEGATIVE mg/dL
SPECIFIC GRAVITY, URINE: 1.028 (ref 1.005–1.030)
pH: 6 (ref 5.0–8.0)

## 2015-06-16 NOTE — ED Notes (Signed)
No answer for revitals

## 2015-06-16 NOTE — ED Notes (Signed)
Pt

## 2015-06-16 NOTE — ED Provider Notes (Cosign Needed)
Patient called for room assignment without answer.  Patient LWBS after triage.  I did not see or evaluate patient.  Garlon HatchetLisa M Aureliano Oshields, PA-C 06/16/15 2225

## 2015-06-16 NOTE — ED Notes (Signed)
Pt states partner reports vaginal discharge and that he wants to be evaluated for std, no symptoms

## 2016-01-03 DIAGNOSIS — S62339A Displaced fracture of neck of unspecified metacarpal bone, initial encounter for closed fracture: Secondary | ICD-10-CM

## 2016-01-03 HISTORY — DX: Displaced fracture of neck of unspecified metacarpal bone, initial encounter for closed fracture: S62.339A

## 2016-01-08 ENCOUNTER — Emergency Department (HOSPITAL_COMMUNITY)
Admission: EM | Admit: 2016-01-08 | Discharge: 2016-01-08 | Disposition: A | Discharge status: Home / Self Care | Payer: Self-pay | Attending: Emergency Medicine | Admitting: Emergency Medicine

## 2016-01-08 ENCOUNTER — Emergency Department (HOSPITAL_COMMUNITY): Payer: Self-pay

## 2016-01-08 ENCOUNTER — Encounter (HOSPITAL_COMMUNITY): Payer: Self-pay

## 2016-01-08 DIAGNOSIS — Y999 Unspecified external cause status: Secondary | ICD-10-CM | POA: Insufficient documentation

## 2016-01-08 DIAGNOSIS — M79641 Pain in right hand: Secondary | ICD-10-CM

## 2016-01-08 DIAGNOSIS — Y939 Activity, unspecified: Secondary | ICD-10-CM | POA: Insufficient documentation

## 2016-01-08 DIAGNOSIS — Y929 Unspecified place or not applicable: Secondary | ICD-10-CM | POA: Insufficient documentation

## 2016-01-08 DIAGNOSIS — S62306A Unspecified fracture of fifth metacarpal bone, right hand, initial encounter for closed fracture: Secondary | ICD-10-CM | POA: Insufficient documentation

## 2016-01-08 DIAGNOSIS — F17210 Nicotine dependence, cigarettes, uncomplicated: Secondary | ICD-10-CM | POA: Insufficient documentation

## 2016-01-08 DIAGNOSIS — S62339A Displaced fracture of neck of unspecified metacarpal bone, initial encounter for closed fracture: Secondary | ICD-10-CM

## 2016-01-08 MED ORDER — HYDROCODONE-ACETAMINOPHEN 5-325 MG PO TABS: 1.0000 | ORAL_TABLET | Freq: Four times a day (QID) | ORAL | 0 refills | Status: DC | PRN
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
## 2016-01-08 NOTE — ED Notes (Signed)
Bed: WTR5 Expected date:  Expected time:  Means of arrival:  Comments: 

## 2016-01-08 NOTE — Progress Notes (Signed)
Orthopedic Tech Progress Note Patient Details:  Kathrin GreathouseMarcus P Lebo Sep 02, 1985 [id number]  Ortho Devices Type of Ortho Device: Ulna gutter splint Ortho Device/Splint Location: rue Ortho Device/Splint Interventions: Ordered, Application   Trinna PostMartinez, Hartwell Vandiver J 01/08/2016, 5:38 AM

## 2016-01-08 NOTE — ED Notes (Signed)
[facility]
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
## 2016-01-12 ENCOUNTER — Encounter (HOSPITAL_BASED_OUTPATIENT_CLINIC_OR_DEPARTMENT_OTHER): Payer: Self-pay | Admitting: *Deleted

## 2016-01-12 ENCOUNTER — Other Ambulatory Visit: Payer: Self-pay | Admitting: Orthopedic Surgery

## 2016-01-12 DIAGNOSIS — J3489 Other specified disorders of nose and nasal sinuses: Secondary | ICD-10-CM

## 2016-01-12 HISTORY — DX: Other specified disorders of nose and nasal sinuses: J34.89

## 2016-01-15 ENCOUNTER — Encounter (HOSPITAL_BASED_OUTPATIENT_CLINIC_OR_DEPARTMENT_OTHER)
Admission: RE | Disposition: A | Discharge status: Home / Self Care | Payer: Self-pay | Source: Ambulatory Visit | Attending: Orthopedic Surgery

## 2016-01-15 ENCOUNTER — Ambulatory Visit (HOSPITAL_BASED_OUTPATIENT_CLINIC_OR_DEPARTMENT_OTHER): Payer: Self-pay | Admitting: Anesthesiology

## 2016-01-15 ENCOUNTER — Encounter (HOSPITAL_BASED_OUTPATIENT_CLINIC_OR_DEPARTMENT_OTHER): Payer: Self-pay | Admitting: *Deleted

## 2016-01-15 ENCOUNTER — Ambulatory Visit (HOSPITAL_BASED_OUTPATIENT_CLINIC_OR_DEPARTMENT_OTHER)
Admission: RE | Admit: 2016-01-15 | Discharge: 2016-01-15 | Disposition: A | Discharge status: Home / Self Care | Payer: Self-pay | Source: Ambulatory Visit | Attending: Orthopedic Surgery | Admitting: Orthopedic Surgery

## 2016-01-15 DIAGNOSIS — X58XXXA Exposure to other specified factors, initial encounter: Secondary | ICD-10-CM | POA: Insufficient documentation

## 2016-01-15 DIAGNOSIS — F17290 Nicotine dependence, other tobacco product, uncomplicated: Secondary | ICD-10-CM | POA: Insufficient documentation

## 2016-01-15 DIAGNOSIS — S62336A Displaced fracture of neck of fifth metacarpal bone, right hand, initial encounter for closed fracture: Secondary | ICD-10-CM | POA: Insufficient documentation

## 2016-01-15 HISTORY — DX: Displaced fracture of neck of unspecified metacarpal bone, initial encounter for closed fracture: S62.339A

## 2016-01-15 HISTORY — DX: Other specified disorders of nose and nasal sinuses: J34.89

## 2016-01-15 HISTORY — PX: OPEN REDUCTION INTERNAL FIXATION (ORIF) HAND: SHX5991

## 2016-01-15 SURGERY — OPEN REDUCTION INTERNAL FIXATION (ORIF) HAND
Anesthesia: General | Site: Hand | Laterality: Right

## 2016-01-15 MED ORDER — KETOROLAC TROMETHAMINE 30 MG/ML IJ SOLN
INTRAMUSCULAR | Status: AC
  Filled 2016-01-15: qty 1

## 2016-01-15 MED ORDER — MIDAZOLAM HCL 2 MG/2ML IJ SOLN
INTRAMUSCULAR | Status: AC
  Filled 2016-01-15: qty 2

## 2016-01-15 MED ORDER — DEXAMETHASONE SODIUM PHOSPHATE 10 MG/ML IJ SOLN
INTRAMUSCULAR | Status: AC
  Filled 2016-01-15: qty 1

## 2016-01-15 MED ORDER — 0.9 % SODIUM CHLORIDE (POUR BTL) OPTIME
TOPICAL | Status: DC | PRN
  Administered 2016-01-15: 100 mL

## 2016-01-15 MED ORDER — SCOPOLAMINE 1 MG/3DAYS TD PT72: 1.0000 | MEDICATED_PATCH | Freq: Once | TRANSDERMAL | Status: DC | PRN

## 2016-01-15 MED ORDER — HYDROMORPHONE HCL 1 MG/ML IJ SOLN: 0.2500 mg | INTRAMUSCULAR | Status: DC | PRN

## 2016-01-15 MED ORDER — KETOROLAC TROMETHAMINE 30 MG/ML IJ SOLN
INTRAMUSCULAR | Status: DC | PRN
  Administered 2016-01-15: 30 mg via INTRAVENOUS

## 2016-01-15 MED ORDER — MIDAZOLAM HCL 2 MG/2ML IJ SOLN
1.0000 mg | INTRAMUSCULAR | Status: DC | PRN
  Administered 2016-01-15: 2 mg via INTRAVENOUS

## 2016-01-15 MED ORDER — PROMETHAZINE HCL 25 MG/ML IJ SOLN: 6.2500 mg | INTRAMUSCULAR | Status: DC | PRN

## 2016-01-15 MED ORDER — BUPIVACAINE HCL (PF) 0.25 % IJ SOLN
INTRAMUSCULAR | Status: DC | PRN
  Administered 2016-01-15: 8 mL

## 2016-01-15 MED ORDER — GLYCOPYRROLATE 0.2 MG/ML IJ SOLN: 0.2000 mg | Freq: Once | INTRAMUSCULAR | Status: DC | PRN

## 2016-01-15 MED ORDER — LACTATED RINGERS IV SOLN
INTRAVENOUS | Status: DC
  Administered 2016-01-15 (×2): via INTRAVENOUS

## 2016-01-15 MED ORDER — PROPOFOL 10 MG/ML IV BOLUS
INTRAVENOUS | Status: DC | PRN
  Administered 2016-01-15: 200 mg via INTRAVENOUS

## 2016-01-15 MED ORDER — HYDROCODONE-ACETAMINOPHEN 5-325 MG PO TABS: ORAL_TABLET | ORAL | 0 refills | Status: DC

## 2016-01-15 MED ORDER — KETOROLAC TROMETHAMINE 30 MG/ML IJ SOLN: 30.0000 mg | Freq: Once | INTRAMUSCULAR | Status: DC | PRN

## 2016-01-15 MED ORDER — DEXAMETHASONE SODIUM PHOSPHATE 4 MG/ML IJ SOLN
INTRAMUSCULAR | Status: DC | PRN
  Administered 2016-01-15: 10 mg via INTRAVENOUS

## 2016-01-15 MED ORDER — LIDOCAINE 2% (20 MG/ML) 5 ML SYRINGE
INTRAMUSCULAR | Status: AC
  Filled 2016-01-15: qty 5

## 2016-01-15 MED ORDER — LIDOCAINE 2% (20 MG/ML) 5 ML SYRINGE
INTRAMUSCULAR | Status: DC | PRN
  Administered 2016-01-15: 50 mg via INTRAVENOUS

## 2016-01-15 MED ORDER — FENTANYL CITRATE (PF) 100 MCG/2ML IJ SOLN
INTRAMUSCULAR | Status: AC
  Filled 2016-01-15: qty 2

## 2016-01-15 MED ORDER — CHLORHEXIDINE GLUCONATE 4 % EX LIQD: 60.0000 mL | Freq: Once | CUTANEOUS | Status: DC

## 2016-01-15 MED ORDER — ONDANSETRON HCL 4 MG/2ML IJ SOLN
INTRAMUSCULAR | Status: DC | PRN
  Administered 2016-01-15: 4 mg via INTRAVENOUS

## 2016-01-15 MED ORDER — FENTANYL CITRATE (PF) 100 MCG/2ML IJ SOLN
50.0000 ug | INTRAMUSCULAR | Status: DC | PRN
  Administered 2016-01-15: 100 ug via INTRAVENOUS

## 2016-01-15 MED ORDER — CEFAZOLIN SODIUM-DEXTROSE 2-4 GM/100ML-% IV SOLN
2.0000 g | INTRAVENOUS | Status: AC
  Administered 2016-01-15: 2 g via INTRAVENOUS

## 2016-01-15 SURGICAL SUPPLY — 54 items
BANDAGE ACE 3X5.8 VEL STRL LF (GAUZE/BANDAGES/DRESSINGS) ×4 IMPLANT
BLADE SURG 15 STRL LF DISP TIS (BLADE) ×4 IMPLANT
BLADE SURG 15 STRL SS (BLADE) ×8
BNDG CMPR 9X4 STRL LF SNTH (GAUZE/BANDAGES/DRESSINGS) ×2
BNDG ELASTIC 2X5.8 VLCR STR LF (GAUZE/BANDAGES/DRESSINGS) IMPLANT
BNDG ESMARK 4X9 LF (GAUZE/BANDAGES/DRESSINGS) ×4 IMPLANT
BNDG GAUZE ELAST 4 BULKY (GAUZE/BANDAGES/DRESSINGS) ×4 IMPLANT
CHLORAPREP W/TINT 26ML (MISCELLANEOUS) ×4 IMPLANT
CORDS BIPOLAR (ELECTRODE) ×4 IMPLANT
COVER BACK TABLE 60X90IN (DRAPES) ×4 IMPLANT
COVER MAYO STAND STRL (DRAPES) ×4 IMPLANT
CUFF TOURNIQUET SINGLE 18IN (TOURNIQUET CUFF) ×4 IMPLANT
DRAPE EXTREMITY T 121X128X90 (DRAPE) ×4 IMPLANT
DRAPE OEC MINIVIEW 54X84 (DRAPES) ×4 IMPLANT
DRAPE SURG 17X23 STRL (DRAPES) ×4 IMPLANT
GAUZE SPONGE 4X4 12PLY STRL (GAUZE/BANDAGES/DRESSINGS) ×4 IMPLANT
GAUZE XEROFORM 1X8 LF (GAUZE/BANDAGES/DRESSINGS) ×4 IMPLANT
GLOVE BIO SURGEON STRL SZ7.5 (GLOVE) ×4 IMPLANT
GLOVE BIOGEL PI IND STRL 7.0 (GLOVE) ×1 IMPLANT
GLOVE BIOGEL PI IND STRL 8 (GLOVE) ×2 IMPLANT
GLOVE BIOGEL PI INDICATOR 7.0 (GLOVE) ×2
GLOVE BIOGEL PI INDICATOR 8 (GLOVE) ×2
GLOVE ECLIPSE 7.0 STRL STRAW (GLOVE) ×3 IMPLANT
GOWN STRL REUS W/ TWL LRG LVL3 (GOWN DISPOSABLE) ×2 IMPLANT
GOWN STRL REUS W/ TWL XL LVL3 (GOWN DISPOSABLE) ×2 IMPLANT
GOWN STRL REUS W/TWL LRG LVL3 (GOWN DISPOSABLE) ×4
GOWN STRL REUS W/TWL XL LVL3 (GOWN DISPOSABLE) ×4
K-WIRE .035X4 (WIRE) ×12 IMPLANT
NDL HYPO 25X1 1.5 SAFETY (NEEDLE) IMPLANT
NEEDLE HYPO 22GX1.5 SAFETY (NEEDLE) IMPLANT
NEEDLE HYPO 25X1 1.5 SAFETY (NEEDLE) ×4 IMPLANT
NS IRRIG 1000ML POUR BTL (IV SOLUTION) ×4 IMPLANT
PACK BASIN DAY SURGERY FS (CUSTOM PROCEDURE TRAY) ×4 IMPLANT
PAD CAST 3X4 CTTN HI CHSV (CAST SUPPLIES) ×2 IMPLANT
PADDING CAST ABS 4INX4YD NS (CAST SUPPLIES) ×2
PADDING CAST ABS COTTON 4X4 ST (CAST SUPPLIES) ×2 IMPLANT
PADDING CAST COTTON 3X4 STRL (CAST SUPPLIES) ×4
SLEEVE SCD COMPRESS KNEE MED (MISCELLANEOUS) IMPLANT
SPLINT PLASTER CAST XFAST 3X15 (CAST SUPPLIES) IMPLANT
SPLINT PLASTER CAST XFAST 4X15 (CAST SUPPLIES) IMPLANT
SPLINT PLASTER XTRA FAST SET 4 (CAST SUPPLIES)
SPLINT PLASTER XTRA FASTSET 3X (CAST SUPPLIES)
STOCKINETTE 4X48 STRL (DRAPES) ×4 IMPLANT
SUT CHROMIC 5 0 P 3 (SUTURE) ×4 IMPLANT
SUT ETHILON 3 0 PS 1 (SUTURE) IMPLANT
SUT ETHILON 4 0 PS 2 18 (SUTURE) ×4 IMPLANT
SUT MERSILENE 4 0 P 3 (SUTURE) IMPLANT
SUT VIC AB 3-0 PS1 18 (SUTURE)
SUT VIC AB 3-0 PS1 18XBRD (SUTURE) IMPLANT
SUT VICRYL 4-0 PS2 18IN ABS (SUTURE) IMPLANT
SYR BULB 3OZ (MISCELLANEOUS) ×4 IMPLANT
SYR CONTROL 10ML LL (SYRINGE) IMPLANT
TOWEL OR 17X24 6PK STRL BLUE (TOWEL DISPOSABLE) ×8 IMPLANT
UNDERPAD 30X30 (UNDERPADS AND DIAPERS) ×4 IMPLANT

## 2016-01-15 NOTE — Brief Op Note (Signed)
01/15/2016  4:11 PM  PATIENT:  Jeff Manning  30 y.o. male  PRE-OPERATIVE DIAGNOSIS:  displaced fracture right small metacarpal neck  POST-OPERATIVE DIAGNOSIS:  displaced fracture right small metacarpal neck  PROCEDURE:  Procedure(s): ORIF right small metacarpal fracture with pinning (Right)  SURGEON:  Surgeon(s) and Role:    * Betha LoaKevin Farzana Koci, MD - Primary  PHYSICIAN ASSISTANT:   ASSISTANTS: none   ANESTHESIA:   general  EBL:  Total I/O In: 1000 [I.V.:1000] Out: 2 [Blood:2]  BLOOD ADMINISTERED:none  DRAINS: none   LOCAL MEDICATIONS USED:  MARCAINE     SPECIMEN:  No Specimen  DISPOSITION OF SPECIMEN:  N/A  COUNTS:  YES  TOURNIQUET:   Total Tourniquet Time Documented: Upper Arm (Right) - 48 minutes Total: Upper Arm (Right) - 48 minutes   DICTATION: .Other Dictation: Dictation Number [id number]  PLAN OF CARE: Discharge to home after PACU  PATIENT DISPOSITION:  PACU - hemodynamically stable.

## 2016-01-15 NOTE — Op Note (Signed)
Intra-operative fluoroscopic images in the AP, lateral, and oblique views were taken and evaluated by myself.  Reduction and hardware placement were confirmed.  There was no intraarticular penetration of permanent hardware.  

## 2016-01-15 NOTE — Anesthesia Preprocedure Evaluation (Signed)
Anesthesia Evaluation  Patient identified by MRN, date of birth, ID band Patient awake    Reviewed: Allergy & Precautions, NPO status , Patient's Chart, lab work & pertinent test results  Airway Mallampati: II  TM Distance: >3 FB Neck ROM: Full    Dental  (+) Dental Advisory Given   Pulmonary Current Smoker,    breath sounds clear to auscultation       Cardiovascular negative cardio ROS   Rhythm:Regular Rate:Normal     Neuro/Psych negative neurological ROS     GI/Hepatic negative GI ROS, Neg liver ROS,   Endo/Other  negative endocrine ROS  Renal/GU negative Renal ROS     Musculoskeletal   Abdominal   Peds  Hematology negative hematology ROS (+)   Anesthesia Other Findings   Reproductive/Obstetrics                             Anesthesia Physical Anesthesia Plan  ASA: I  Anesthesia Plan: General   Post-op Pain Management:    Induction: Intravenous  Airway Management Planned: LMA  Additional Equipment:   Intra-op Plan:   Post-operative Plan: Extubation in OR  Informed Consent: I have reviewed the patients History and Physical, chart, labs and discussed the procedure including the risks, benefits and alternatives for the proposed anesthesia with the patient or authorized representative who has indicated his/her understanding and acceptance.   Dental advisory given  Plan Discussed with:   Anesthesia Plan Comments:        Anesthesia Quick Evaluation  

## 2016-01-15 NOTE — Op Note (Signed)
[id number] 

## 2016-01-15 NOTE — Discharge Instructions (Addendum)
[facility] Instructions °Hand Surgery ° °Wound Care: °Keep your hand elevated above the level of your heart.  Do not allow it to dangle by your side.  Keep the dressing dry and do not remove it unless your doctor advises you to do so.  He will usually change it at the time of your post-op visit.  Moving your fingers is advised to stimulate circulation but will depend on the site of your surgery.  If you have a splint applied, your doctor will advise you regarding movement. ° °Activity: °Do not drive or operate machinery today.  Rest today and then you may return to your normal activity and work as indicated by your physician. ° °Diet:  °Drink liquids today or eat a light diet.  You may resume a regular diet tomorrow.   ° °General expectations: °Pain for two to three days. °Fingers may become slightly swollen. ° °Call your doctor if any of the following occur: °Severe pain not relieved by pain medication. °Elevated temperature. °Dressing soaked with blood. °Inability to move fingers. °White or bluish color to fingers. ° ° ° °Post Anesthesia Home Care Instructions ° °Activity: °Get plenty of rest for the remainder of the day. A responsible adult should stay with you for 24 hours following the procedure.  °For the next 24 hours, DO NOT: °-Drive a car °-Operate machinery °-Drink alcoholic beverages °-Take any medication unless instructed by your physician °-Make any legal decisions or sign important papers. ° °Meals: °Start with liquid foods such as gelatin or soup. Progress to regular foods as tolerated. Avoid greasy, spicy, heavy foods. If nausea and/or vomiting occur, drink only clear liquids until the nausea and/or vomiting subsides. Call your physician if vomiting continues. ° °Special Instructions/Symptoms: °Your throat may feel dry or sore from the anesthesia or the breathing tube placed in your throat during surgery. If this causes discomfort, gargle with warm salt water. The discomfort should disappear within  24 hours. ° °If you had a scopolamine patch placed behind your ear for the management of post- operative nausea and/or vomiting: ° °1. The medication in the patch is effective for 72 hours, after which it should be removed.  Wrap patch in a tissue and discard in the trash. Wash hands thoroughly with soap and water. °2. You may remove the patch earlier than 72 hours if you experience unpleasant side effects which may include dry mouth, dizziness or visual disturbances. °3. Avoid touching the patch. Wash your hands with soap and water after contact with the patch. °  °Call your surgeon if you experience:  ° °1.  Fever over 101.0. °2.  Inability to urinate. °3.  Nausea and/or vomiting. °4.  Extreme swelling or bruising at the surgical site. °5.  Continued bleeding from the incision. °6.  Increased pain, redness or drainage from the incision. °7.  Problems related to your pain medication. °8.  Any problems and/or concerns °

## 2016-01-15 NOTE — Anesthesia Procedure Notes (Signed)
Procedure Name: LMA Insertion Performed by: Willet Schleifer W Pre-anesthesia Checklist: Patient identified, Emergency Drugs available, Suction available and Patient being monitored Patient Re-evaluated:Patient Re-evaluated prior to inductionOxygen Delivery Method: Circle system utilized Preoxygenation: Pre-oxygenation with 100% oxygen Intubation Type: IV induction Ventilation: Mask ventilation without difficulty LMA: LMA inserted LMA Size: 5.0 Number of attempts: 1 Airway Equipment and Method: Bite block Placement Confirmation: positive ETCO2 Tube secured with: Tape Dental Injury: Teeth and Oropharynx as per pre-operative assessment        
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
## 2016-01-15 NOTE — Anesthesia Postprocedure Evaluation (Signed)
Anesthesia Post Note  Patient: Jeff Manning  Procedure(s) Performed: Procedure(s) (LRB): ORIF right small metacarpal fracture with pinning (Right)  Patient location during evaluation: PACU Anesthesia Type: General Level of consciousness: awake and alert Pain management: pain level controlled Vital Signs Assessment: post-procedure vital signs reviewed and stable Respiratory status: spontaneous breathing, nonlabored ventilation, respiratory function stable and patient connected to nasal cannula oxygen Cardiovascular status: blood pressure returned to baseline and stable Postop Assessment: no signs of nausea or vomiting Anesthetic complications: no    Last Vitals:  Vitals:   01/15/16 1700 01/15/16 1734  BP: (!) 149/112 (!) 144/98  Pulse: 89 83  Resp: 17 16  Temp:  36.4 C    Last Pain:  Vitals:   01/15/16 1734  TempSrc: Oral  PainSc: 3                  Kennieth RadFitzgerald, Senia Even E

## 2016-01-15 NOTE — Transfer of Care (Signed)
Immediate Anesthesia Transfer of Care Note  Patient: Jeff Manning  Procedure(s) Performed: Procedure(s): ORIF right small metacarpal fracture with pinning (Right)  Patient Location: PACU  Anesthesia Type:General  Level of Consciousness: awake, sedated and responds to stimulation  Airway & Oxygen Therapy: Patient Spontanous Breathing and Patient connected to face mask oxygen  Post-op Assessment: Report given to RN, Post -op Vital signs reviewed and stable and Patient moving all extremities  Post vital signs: Reviewed and stable  Last Vitals:  Vitals:   01/15/16 1301 01/15/16 1624  BP: (!) 156/84   Pulse: 89 86  Resp: 18 (!) 28  Temp: 36.7 C     Last Pain:  Vitals:   01/15/16 1301  TempSrc: Oral  PainSc: 2          Complications: No apparent anesthesia complications
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
## 2016-01-18 ENCOUNTER — Encounter (HOSPITAL_BASED_OUTPATIENT_CLINIC_OR_DEPARTMENT_OTHER): Payer: Self-pay | Admitting: Orthopedic Surgery

## 2016-03-24 ENCOUNTER — Encounter (HOSPITAL_COMMUNITY): Payer: Self-pay | Admitting: Emergency Medicine

## 2016-03-24 ENCOUNTER — Emergency Department (HOSPITAL_COMMUNITY): Payer: Self-pay

## 2016-03-24 ENCOUNTER — Emergency Department (HOSPITAL_COMMUNITY)
Admission: EM | Admit: 2016-03-24 | Discharge: 2016-03-24 | Discharge status: Against Medical Advice | Payer: Self-pay | Attending: Emergency Medicine | Admitting: Emergency Medicine

## 2016-03-24 DIAGNOSIS — F17290 Nicotine dependence, other tobacco product, uncomplicated: Secondary | ICD-10-CM | POA: Insufficient documentation

## 2016-03-24 DIAGNOSIS — M869 Osteomyelitis, unspecified: Secondary | ICD-10-CM | POA: Diagnosis present

## 2016-03-24 DIAGNOSIS — T148XXA Other injury of unspecified body region, initial encounter: Secondary | ICD-10-CM

## 2016-03-24 DIAGNOSIS — L089 Local infection of the skin and subcutaneous tissue, unspecified: Secondary | ICD-10-CM | POA: Insufficient documentation

## 2016-03-24 LAB — URINALYSIS, ROUTINE W REFLEX MICROSCOPIC
Bacteria, UA: NONE SEEN
Bilirubin Urine: NEGATIVE
Glucose, UA: NEGATIVE mg/dL
Hgb urine dipstick: NEGATIVE
KETONES UR: NEGATIVE mg/dL
Nitrite: NEGATIVE
PROTEIN: 100 mg/dL — AB
SQUAMOUS EPITHELIAL / LPF: NONE SEEN
Specific Gravity, Urine: 1.034 — ABNORMAL HIGH (ref 1.005–1.030)
pH: 5 (ref 5.0–8.0)

## 2016-03-24 LAB — CBC WITH DIFFERENTIAL/PLATELET
Basophils Absolute: 0 10*3/uL (ref 0.0–0.1)
Basophils Relative: 0 %
EOS ABS: 0.1 10*3/uL (ref 0.0–0.7)
EOS PCT: 1 %
HCT: 43.5 % (ref 39.0–52.0)
HEMOGLOBIN: 15.2 g/dL (ref 13.0–17.0)
LYMPHS ABS: 2.8 10*3/uL (ref 0.7–4.0)
LYMPHS PCT: 19 %
MCH: 30.2 pg (ref 26.0–34.0)
MCHC: 34.9 g/dL (ref 30.0–36.0)
MCV: 86.3 fL (ref 78.0–100.0)
MONOS PCT: 11 %
Monocytes Absolute: 1.6 10*3/uL — ABNORMAL HIGH (ref 0.1–1.0)
NEUTROS PCT: 69 %
Neutro Abs: 10.5 10*3/uL — ABNORMAL HIGH (ref 1.7–7.7)
Platelets: 336 10*3/uL (ref 150–400)
RBC: 5.04 MIL/uL (ref 4.22–5.81)
RDW: 12.6 % (ref 11.5–15.5)
WBC: 15.1 10*3/uL — AB (ref 4.0–10.5)

## 2016-03-24 LAB — COMPREHENSIVE METABOLIC PANEL
ALT: 22 U/L (ref 17–63)
AST: 21 U/L (ref 15–41)
Albumin: 3.5 g/dL (ref 3.5–5.0)
Alkaline Phosphatase: 59 U/L (ref 38–126)
Anion gap: 7 (ref 5–15)
BUN: 13 mg/dL (ref 6–20)
CHLORIDE: 104 mmol/L (ref 101–111)
CO2: 25 mmol/L (ref 22–32)
CREATININE: 1.28 mg/dL — AB (ref 0.61–1.24)
Calcium: 8.2 mg/dL — ABNORMAL LOW (ref 8.9–10.3)
GFR calc Af Amer: >60 mL/min (ref >60)
Glucose, Bld: 96 mg/dL (ref 65–99)
POTASSIUM: 3.2 mmol/L — AB (ref 3.5–5.1)
SODIUM: 136 mmol/L (ref 135–145)
Total Bilirubin: 0.3 mg/dL (ref 0.3–1.2)
Total Protein: 7.8 g/dL (ref 6.5–8.1)

## 2016-03-24 LAB — I-STAT CG4 LACTIC ACID, ED
LACTIC ACID, VENOUS: 1.18 mmol/L (ref 0.5–1.9)
LACTIC ACID, VENOUS: 1.23 mmol/L (ref 0.5–1.9)

## 2016-03-24 MED ORDER — HYDROCODONE-ACETAMINOPHEN 5-325 MG PO TABS
1.0000 | ORAL_TABLET | Freq: Once | ORAL | Status: AC
  Administered 2016-03-24: 1 via ORAL
  Filled 2016-03-24: qty 1

## 2016-03-24 MED ORDER — SODIUM CHLORIDE 0.9 % IV SOLN
3.0000 g | Freq: Once | INTRAVENOUS | Status: AC
  Administered 2016-03-24: 3 g via INTRAVENOUS
  Filled 2016-03-24: qty 3

## 2016-03-24 NOTE — ED Notes (Signed)
Pt called for blood draw from the lobby with no response.  RN notified.

## 2016-03-24 NOTE — ED Notes (Signed)
ED Provider at bedside. 

## 2016-03-24 NOTE — ED Notes (Signed)
Pt called for blood work [facility]

## 2016-03-24 NOTE — ED Triage Notes (Signed)
Patient reports he had recent right fifth finger fracture and had the pins removed x2 days ago. Since that time patient reports increased swelling and pain at site. Patient reports white drainage. Movement and sensation to all fingers.

## 2016-03-24 NOTE — ED Provider Notes (Signed)
[facility]-EMERGENCY DEPT Provider Note   CSN: [id number] Arrival date & time: 03/24/16  1529 By signing my name below, I, Levon HedgerElizabeth Hall, attest that this documentation has been prepared under the direction and in the presence of non-physician practitioner, Arnoldo HookerShari A Valene Villa PA-C,. Electronically Signed: Levon HedgerElizabeth Hall, Scribe. 03/24/2016. 8:37 PM.   History   Chief Complaint Chief Complaint  Patient presents with  . Hand Pain    HPI Jeff Manning is an otherwise healthy, right hand dominant 30 y.o. male who presents to the Emergency Department complaining of constant, gradually worsening right hand pain onset two days ago. Pt states he fractured his right fifth finger in 10/17 and had pins removed two days ago. Since that time, pt notes increased pain and associated swelling and white drainage to the site. Pt also reports associated erythema which began 4-5 days ago. Pt started on Bactrim when the pin was removed and told to come to the ED if symptoms worsened. He reports taking 4 ibuprofen PTA with no relief of pain. He is able to move his fingers. Pt denies any fever, nausea, vomiting, or numbness.    The history is provided by the patient. No language interpreter was used.    Past Medical History:  Diagnosis Date  . Fracture of metacarpal neck of right hand, closed 01/03/2016  . Stuffy and runny nose 01/12/2016   clear drainage from nose, per pt.    There are no active problems to display for this patient.   Past Surgical History:  Procedure Laterality Date  . NO PAST SURGERIES    . OPEN REDUCTION INTERNAL FIXATION (ORIF) HAND Right 01/15/2016   Procedure: ORIF right small metacarpal fracture with pinning;  Surgeon: Betha LoaKevin Kuzma, MD;  Location: [facility];  Service: Orthopedics;  Laterality: Right;     Home Medications    Prior to Admission medications   Medication Sig Start Date End Date Taking? Authorizing Provider  CRANBERRY EXTRACT PO Take by mouth.     Historical Provider, MD  HYDROcodone-acetaminophen [facility](NORCO) 5-325 MG tablet 1-2 tabs po q6 hours prn pain 01/15/16   Betha LoaKevin Kuzma, MD  HYDROcodone-acetaminophen (NORCO/VICODIN) 5-325 MG tablet Take 1 tablet by mouth every 6 (six) hours as needed for moderate pain. 01/08/16   Fayrene HelperBowie Tran, PA-C    Family History History reviewed. No pertinent family history.  Social History Social History  Substance Use Topics  . Smoking status: Current Every Day Smoker    Years: 10.00    Types: Cigars  . Smokeless tobacco: Never Used     Comment: 1-2 Black and Mild/day  . Alcohol use Yes     Comment: occasionally     Allergies   Patient has no known allergies.   Review of Systems Review of Systems  Constitutional: Negative for fever.  Eyes: Negative for visual disturbance.  Respiratory: Negative for cough.   Cardiovascular: Negative for chest pain.  Gastrointestinal: Negative for nausea and vomiting.  Musculoskeletal: Positive for arthralgias, joint swelling and myalgias.  Skin: Positive for color change.  Allergic/Immunologic: Negative for immunocompromised state.  Neurological: Negative for numbness.  Hematological: Does not bruise/bleed easily.    Physical Exam Updated Vital Signs BP 122/91 (BP Location: Left Arm)   Pulse 111   Temp 98.7 F (37.1 C) (Oral)   Resp 20   SpO2 100%   Physical Exam  Constitutional: He is oriented to person, place, and time. He appears well-developed and well-nourished. No distress.  HENT:  Head: Normocephalic and atraumatic.  Eyes: Conjunctivae are normal.  Cardiovascular: Normal rate.   Pulmonary/Chest: Effort normal.  Abdominal: He exhibits no distension.  Musculoskeletal: He exhibits edema.  Right hand has significant dorsal swelling and redness. There are multiple dorsal scars over 5th MCP joint. Warm to the touch, no active drainage.   Neurological: He is alert and oriented to person, place, and time.  Skin: Skin is warm and dry.    Psychiatric: He has a normal mood and affect.  Nursing note and vitals reviewed.   ED Treatments / Results  DIAGNOSTIC STUDIES:  Oxygen Saturation is % on ,  by my interpretation.    COORDINATION OF CARE:  8:28 PM Discussed treatment plan with pt at bedside and pt agreed to plan.   Labs (all labs ordered are listed, but only abnormal results are displayed) Labs Reviewed  CBC WITH DIFFERENTIAL/PLATELET - Abnormal; Notable for the following:       Result Value   WBC 15.1 (*)    Neutro Abs 10.5 (*)    Monocytes Absolute 1.6 (*)    All other components within normal limits  COMPREHENSIVE METABOLIC PANEL  URINALYSIS, ROUTINE W REFLEX MICROSCOPIC  I-STAT CG4 LACTIC ACID, ED   Results for orders placed or performed during the hospital encounter of 03/24/16  Comprehensive metabolic panel  Result Value Ref Range   Sodium 136 135 - 145 mmol/L   Potassium 3.2 (L) 3.5 - 5.1 mmol/L   Chloride 104 101 - 111 mmol/L   CO2 25 22 - 32 mmol/L   Glucose, Bld 96 65 - 99 mg/dL   BUN 13 6 - 20 mg/dL   Creatinine, Ser 1.61 (H) 0.61 - 1.24 mg/dL   Calcium 8.2 (L) 8.9 - 10.3 mg/dL   Total Protein 7.8 6.5 - 8.1 g/dL   Albumin 3.5 3.5 - 5.0 g/dL   AST 21 15 - 41 U/L   ALT 22 17 - 63 U/L   Alkaline Phosphatase 59 38 - 126 U/L   Total Bilirubin 0.3 0.3 - 1.2 mg/dL   GFR calc non Af Amer >60 >60 mL/min   GFR calc Af Amer >60 >60 mL/min   Anion gap 7 5 - 15  CBC with Differential  Result Value Ref Range   WBC 15.1 (H) 4.0 - 10.5 K/uL   RBC 5.04 4.22 - 5.81 MIL/uL   Hemoglobin 15.2 13.0 - 17.0 g/dL   HCT 09.6 04.5 - 40.9 %   MCV 86.3 78.0 - 100.0 fL   MCH 30.2 26.0 - 34.0 pg   MCHC 34.9 30.0 - 36.0 g/dL   RDW 81.1 91.4 - 78.2 %   Platelets 336 150 - 400 K/uL   Neutrophils Relative % 69 %   Neutro Abs 10.5 (H) 1.7 - 7.7 K/uL   Lymphocytes Relative 19 %   Lymphs Abs 2.8 0.7 - 4.0 K/uL   Monocytes Relative 11 %   Monocytes Absolute 1.6 (H) 0.1 - 1.0 K/uL   Eosinophils Relative 1 %    Eosinophils Absolute 0.1 0.0 - 0.7 K/uL   Basophils Relative 0 %   Basophils Absolute 0.0 0.0 - 0.1 K/uL  Urinalysis, Routine w reflex microscopic  Result Value Ref Range   Color, Urine YELLOW YELLOW   APPearance CLEAR CLEAR   Specific Gravity, Urine 1.034 (H) 1.005 - 1.030   pH 5.0 5.0 - 8.0   Glucose, UA NEGATIVE NEGATIVE mg/dL   Hgb urine dipstick NEGATIVE NEGATIVE   Bilirubin Urine NEGATIVE NEGATIVE   Ketones, ur NEGATIVE  NEGATIVE mg/dL   Protein, ur 161100 (A) NEGATIVE mg/dL   Nitrite NEGATIVE NEGATIVE   Leukocytes, UA TRACE (A) NEGATIVE   RBC / HPF 0-5 0 - 5 RBC/hpf   WBC, UA 0-5 0 - 5 WBC/hpf   Bacteria, UA NONE SEEN NONE SEEN   Squamous Epithelial / LPF NONE SEEN NONE SEEN   Mucous PRESENT   I-Stat CG4 Lactic Acid, ED  Result Value Ref Range   Lactic Acid, Venous 1.18 0.5 - 1.9 mmol/L  I-Stat CG4 Lactic Acid, ED  Result Value Ref Range   Lactic Acid, Venous 1.23 0.5 - 1.9 mmol/L   Dg Hand Complete Right  Result Date: 03/24/2016 CLINICAL DATA:  Right fifth metacarpal fracture, status post pins removal 2 days ago EXAM: RIGHT HAND - COMPLETE 3+ VIEW COMPARISON:  01/08/2016 FINDINGS: Three views of the right hand submitted. There is healing fracture distal aspect of the fifth metacarpal with anatomic alignment. There is subtle cortical lytic lesion mid shaft of fifth metacarpal. Subtle cortical lytic lesion distal shaft of fourth metacarpal. Significant soft tissue swelling metacarpal region. Osteomyelitis cannot be excluded. Clinical correlation is necessary. Further correlation with MRI is recommended. IMPRESSION: There is healing fracture distal aspect of the fifth metacarpal with anatomic alignment. There is subtle cortical lytic lesion mid shaft of fifth metacarpal. Subtle cortical lytic lesion distal shaft of fourth metacarpal. Significant soft tissue swelling metacarpal region. Osteomyelitis cannot be excluded. Clinical correlation is necessary. Further correlation with MRI is  recommended. Electronically Signed   By: Natasha MeadLiviu  Pop M.D.   On: 03/24/2016 21:47    EKG  EKG Interpretation None       Radiology No results found.  Procedures Procedures (including critical care time)  Medications Ordered in ED Medications - No data to display   Initial Impression / Assessment and Plan / ED Course  I have reviewed the triage vital signs and the nursing notes.  Pertinent labs & imaging results that were available during my care of the patient were reviewed by me and considered in my medical decision making (see chart for details).  Clinical Course     Patient presents with hand pain and swelling associated with site of recent fracture requiring open reduction and pinning, pins removed on 03/22/16. He has been on Septra since with scheduled recheck tomorrow (03/25/16). Followed by Dr. Merlyn LotKuzma.  Here the hand is swollen and red over dorsal 5th distal MC and MCP. FROM of the MCP and digit without difficulty. No proximal redness. He is tachycardic with leukocytosis, but no fever or systemic symptoms. No elevated lactate. Hand x-ray show signs concerning for osteo, MRI not available to confirm at this time.   The patient was given Unasyn in the ED. Plan was to admit for potential osteomyelitis but the patient prefers discharge home and does not want admission to the hospital. Discussed with Dr. Cindee SaltGary Kuzma who will make Betha LoaKevin Kuzma aware of essential follow up tomorrow.   Patient will be discharged AMA. Stressed importance of follow up with the office tomorrow. Discussed risk of untreated infection including amputation. The patient acknowledges understanding and states he will follow up per scheduled appointment.   Final Clinical Impressions(s) / ED Diagnoses   Final diagnoses:  None   1. Right hand infection  New Prescriptions New Prescriptions   No medications on file  I personally performed the services described in this documentation, which was scribed in my  presence. The recorded information has been reviewed and is accurate.  Elpidio Anis, PA-C 03/24/16 2302    Gerhard Munch, MD 03/25/16 0100

## 2016-03-24 NOTE — ED Notes (Signed)
Pt to desk inquiring about his place to go back  Informed pt of his wait time

## 2016-03-24 NOTE — Discharge Instructions (Signed)
IT IS VERY IMPORTANT TO KEEP YOUR SCHEDULED APPOINTMENT WITH DR. Merlyn LotKUZMA TOMORROW FOR FURTHER INTERVENTION OF THE INFECTION IN YOUR HAND. DO NOT EAT OR DRINK ANYTHING AFTER MIDNIGHT TONIGHT IN ANTICIPATION OF A PROCEDURE TOMORROW.

## 2016-03-25 ENCOUNTER — Encounter (HOSPITAL_COMMUNITY): Payer: Self-pay | Admitting: *Deleted

## 2016-03-25 ENCOUNTER — Encounter (HOSPITAL_COMMUNITY)
Admission: RE | Disposition: A | Discharge status: Home / Self Care | Payer: Self-pay | Source: Ambulatory Visit | Attending: Orthopedic Surgery

## 2016-03-25 ENCOUNTER — Inpatient Hospital Stay (HOSPITAL_COMMUNITY): Payer: Self-pay | Admitting: Anesthesiology

## 2016-03-25 ENCOUNTER — Ambulatory Visit (HOSPITAL_COMMUNITY)
Admission: RE | Admit: 2016-03-25 | Discharge: 2016-03-25 | Disposition: A | Discharge status: Home / Self Care | Payer: Self-pay | Source: Ambulatory Visit | Attending: Orthopedic Surgery | Admitting: Orthopedic Surgery

## 2016-03-25 ENCOUNTER — Other Ambulatory Visit: Payer: Self-pay | Admitting: Orthopedic Surgery

## 2016-03-25 DIAGNOSIS — L02511 Cutaneous abscess of right hand: Secondary | ICD-10-CM | POA: Insufficient documentation

## 2016-03-25 DIAGNOSIS — F17290 Nicotine dependence, other tobacco product, uncomplicated: Secondary | ICD-10-CM | POA: Insufficient documentation

## 2016-03-25 HISTORY — PX: I&D EXTREMITY: SHX5045

## 2016-03-25 LAB — SEDIMENTATION RATE: Sed Rate: 23 mm/hr — ABNORMAL HIGH (ref 0–16)

## 2016-03-25 SURGERY — IRRIGATION AND DEBRIDEMENT EXTREMITY
Anesthesia: General | Site: Hand | Laterality: Right

## 2016-03-25 MED ORDER — 0.9 % SODIUM CHLORIDE (POUR BTL) OPTIME
TOPICAL | Status: DC | PRN
  Administered 2016-03-25: 1000 mL

## 2016-03-25 MED ORDER — MIDAZOLAM HCL 5 MG/5ML IJ SOLN
INTRAMUSCULAR | Status: DC | PRN
  Administered 2016-03-25: 2 mg via INTRAVENOUS

## 2016-03-25 MED ORDER — BUPIVACAINE HCL (PF) 0.25 % IJ SOLN
INTRAMUSCULAR | Status: DC | PRN
  Administered 2016-03-25: 10 mL

## 2016-03-25 MED ORDER — FENTANYL CITRATE (PF) 100 MCG/2ML IJ SOLN
INTRAMUSCULAR | Status: AC
  Filled 2016-03-25: qty 2

## 2016-03-25 MED ORDER — ONDANSETRON HCL 4 MG/2ML IJ SOLN: 4.0000 mg | Freq: Four times a day (QID) | INTRAMUSCULAR | Status: DC | PRN

## 2016-03-25 MED ORDER — FENTANYL CITRATE (PF) 100 MCG/2ML IJ SOLN
INTRAMUSCULAR | Status: AC
  Filled 2016-03-25: qty 4

## 2016-03-25 MED ORDER — ONDANSETRON HCL 4 MG/2ML IJ SOLN
INTRAMUSCULAR | Status: DC | PRN
  Administered 2016-03-25: 4 mg via INTRAVENOUS

## 2016-03-25 MED ORDER — VANCOMYCIN HCL 1000 MG IV SOLR
INTRAVENOUS | Status: DC | PRN
  Administered 2016-03-25: 1000 mg via INTRAVENOUS

## 2016-03-25 MED ORDER — LIDOCAINE 2% (20 MG/ML) 5 ML SYRINGE
INTRAMUSCULAR | Status: DC | PRN
  Administered 2016-03-25: 100 mg via INTRAVENOUS

## 2016-03-25 MED ORDER — VANCOMYCIN HCL IN DEXTROSE 1-5 GM/200ML-% IV SOLN
INTRAVENOUS | Status: AC
  Filled 2016-03-25: qty 200

## 2016-03-25 MED ORDER — HYDROCODONE-ACETAMINOPHEN 5-325 MG PO TABS: ORAL_TABLET | ORAL | 0 refills | Status: AC

## 2016-03-25 MED ORDER — BUPIVACAINE HCL (PF) 0.25 % IJ SOLN
INTRAMUSCULAR | Status: AC
  Filled 2016-03-25: qty 30

## 2016-03-25 MED ORDER — MIDAZOLAM HCL 2 MG/2ML IJ SOLN
INTRAMUSCULAR | Status: AC
  Filled 2016-03-25: qty 2

## 2016-03-25 MED ORDER — PROPOFOL 10 MG/ML IV BOLUS
INTRAVENOUS | Status: DC | PRN
  Administered 2016-03-25: 50 mg via INTRAVENOUS
  Administered 2016-03-25: 200 mg via INTRAVENOUS

## 2016-03-25 MED ORDER — SULFAMETHOXAZOLE-TRIMETHOPRIM 800-160 MG PO TABS: 1.0000 | ORAL_TABLET | Freq: Two times a day (BID) | ORAL | 0 refills | Status: DC

## 2016-03-25 MED ORDER — FENTANYL CITRATE (PF) 100 MCG/2ML IJ SOLN
INTRAMUSCULAR | Status: DC | PRN
  Administered 2016-03-25 (×2): 50 ug via INTRAVENOUS
  Administered 2016-03-25 (×4): 25 ug via INTRAVENOUS
  Administered 2016-03-25 (×2): 50 ug via INTRAVENOUS

## 2016-03-25 MED ORDER — PROPOFOL 10 MG/ML IV BOLUS
INTRAVENOUS | Status: AC
  Filled 2016-03-25: qty 40

## 2016-03-25 MED ORDER — FENTANYL CITRATE (PF) 100 MCG/2ML IJ SOLN
25.0000 ug | INTRAMUSCULAR | Status: DC | PRN
  Administered 2016-03-25: 50 ug via INTRAVENOUS

## 2016-03-25 MED ORDER — LACTATED RINGERS IV SOLN
INTRAVENOUS | Status: DC
  Administered 2016-03-25 (×2): via INTRAVENOUS

## 2016-03-25 MED ORDER — OXYCODONE HCL 5 MG/5ML PO SOLN: 5.0000 mg | Freq: Once | ORAL | Status: DC | PRN

## 2016-03-25 MED ORDER — OXYCODONE HCL 5 MG PO TABS: 5.0000 mg | ORAL_TABLET | Freq: Once | ORAL | Status: DC | PRN

## 2016-03-25 SURGICAL SUPPLY — 53 items
BANDAGE ACE 4X5 VEL STRL LF (GAUZE/BANDAGES/DRESSINGS) ×3 IMPLANT
BANDAGE COBAN STERILE 2 (GAUZE/BANDAGES/DRESSINGS) IMPLANT
BANDAGE ELASTIC 3 VELCRO ST LF (GAUZE/BANDAGES/DRESSINGS) ×3 IMPLANT
BNDG CMPR 9X4 STRL LF SNTH (GAUZE/BANDAGES/DRESSINGS)
BNDG COHESIVE 1X5 TAN STRL LF (GAUZE/BANDAGES/DRESSINGS) IMPLANT
BNDG CONFORM 2 STRL LF (GAUZE/BANDAGES/DRESSINGS) IMPLANT
BNDG ESMARK 4X9 LF (GAUZE/BANDAGES/DRESSINGS) IMPLANT
BNDG GAUZE ELAST 4 BULKY (GAUZE/BANDAGES/DRESSINGS) ×3 IMPLANT
CORDS BIPOLAR (ELECTRODE) ×3 IMPLANT
COVER SURGICAL LIGHT HANDLE (MISCELLANEOUS) ×3 IMPLANT
DECANTER SPIKE VIAL GLASS SM (MISCELLANEOUS) ×6 IMPLANT
DRAIN PENROSE 1/4X12 LTX STRL (WOUND CARE) IMPLANT
DRSG ADAPTIC 3X8 NADH LF (GAUZE/BANDAGES/DRESSINGS) IMPLANT
DRSG EMULSION OIL 3X3 NADH (GAUZE/BANDAGES/DRESSINGS) ×3 IMPLANT
DRSG PAD ABDOMINAL 8X10 ST (GAUZE/BANDAGES/DRESSINGS) ×6 IMPLANT
GAUZE IODOFORM PACK 1/2 7832 (GAUZE/BANDAGES/DRESSINGS) ×2 IMPLANT
GAUZE SPONGE 4X4 12PLY STRL (GAUZE/BANDAGES/DRESSINGS) ×3 IMPLANT
GAUZE XEROFORM 1X8 LF (GAUZE/BANDAGES/DRESSINGS) ×3 IMPLANT
GLOVE BIO SURGEON STRL SZ7.5 (GLOVE) ×3 IMPLANT
GLOVE BIOGEL PI IND STRL 8 (GLOVE) ×1 IMPLANT
GLOVE BIOGEL PI INDICATOR 8 (GLOVE) ×2
GOWN STRL REUS W/ TWL LRG LVL3 (GOWN DISPOSABLE) ×1 IMPLANT
GOWN STRL REUS W/TWL LRG LVL3 (GOWN DISPOSABLE) ×3
KIT BASIN OR (CUSTOM PROCEDURE TRAY) ×3 IMPLANT
KIT ROOM TURNOVER OR (KITS) ×3 IMPLANT
LOOP VESSEL MAXI BLUE (MISCELLANEOUS) IMPLANT
MANIFOLD NEPTUNE II (INSTRUMENTS) ×3 IMPLANT
NEEDLE HYPO 25X1 1.5 SAFETY (NEEDLE) IMPLANT
NS IRRIG 1000ML POUR BTL (IV SOLUTION) ×3 IMPLANT
PACK ORTHO EXTREMITY (CUSTOM PROCEDURE TRAY) ×3 IMPLANT
PAD ARMBOARD 7.5X6 YLW CONV (MISCELLANEOUS) ×6 IMPLANT
PAD CAST 4YDX4 CTTN HI CHSV (CAST SUPPLIES) IMPLANT
PADDING CAST COTTON 4X4 STRL (CAST SUPPLIES) ×3
SCRUB BETADINE 4OZ XXX (MISCELLANEOUS) ×3 IMPLANT
SET CYSTO W/LG BORE CLAMP LF (SET/KITS/TRAYS/PACK) ×3 IMPLANT
SOLUTION BETADINE 4OZ (MISCELLANEOUS) ×3 IMPLANT
SPLINT PLASTER EXTRA FAST 3X15 (CAST SUPPLIES) ×2
SPLINT PLASTER GYPS XFAST 3X15 (CAST SUPPLIES) IMPLANT
SPONGE GAUZE 4X4 12PLY STER LF (GAUZE/BANDAGES/DRESSINGS) ×3 IMPLANT
SPONGE LAP 4X18 X RAY DECT (DISPOSABLE) ×3 IMPLANT
SUT ETHILON 4 0 P 3 18 (SUTURE) IMPLANT
SUT ETHILON 4 0 PS 2 18 (SUTURE) ×3 IMPLANT
SUT MON AB 5-0 P3 18 (SUTURE) IMPLANT
SYR CONTROL 10ML LL (SYRINGE) IMPLANT
TOWEL OR 17X24 6PK STRL BLUE (TOWEL DISPOSABLE) ×3 IMPLANT
TOWEL OR 17X26 10 PK STRL BLUE (TOWEL DISPOSABLE) ×3 IMPLANT
TUBE ANAEROBIC SPECIMEN COL (MISCELLANEOUS) IMPLANT
TUBE CONNECTING 12'X1/4 (SUCTIONS) ×1
TUBE CONNECTING 12X1/4 (SUCTIONS) ×2 IMPLANT
TUBE FEEDING 5FR 15 INCH (TUBING) IMPLANT
UNDERPAD 30X30 (UNDERPADS AND DIAPERS) ×3 IMPLANT
WATER STERILE IRR 1000ML POUR (IV SOLUTION) ×3 IMPLANT
YANKAUER SUCT BULB TIP NO VENT (SUCTIONS) ×3 IMPLANT

## 2016-03-25 NOTE — Anesthesia Procedure Notes (Signed)
Procedure Name: LMA Insertion Date/Time: 03/25/2016 3:47 PM Performed by: Daiva EvesAVENEL, Estephany Perot W Pre-anesthesia Checklist: Patient identified, Emergency Drugs available, Timeout performed, Patient being monitored and Suction available Patient Re-evaluated:Patient Re-evaluated prior to inductionOxygen Delivery Method: Circle system utilized Preoxygenation: Pre-oxygenation with 100% oxygen Intubation Type: IV induction LMA: LMA inserted LMA Size: 5.0 Placement Confirmation: positive ETCO2,  CO2 detector and breath sounds checked- equal and bilateral Tube secured with: Tape Dental Injury: Teeth and Oropharynx as per pre-operative assessment

## 2016-03-25 NOTE — Discharge Instructions (Addendum)

## 2016-03-25 NOTE — Anesthesia Preprocedure Evaluation (Signed)
Anesthesia Evaluation  Patient identified by MRN, date of birth, ID band Patient awake    Reviewed: Allergy & Precautions, H&P , NPO status , Patient's Chart, lab work & pertinent test results  Airway Mallampati: II   Neck ROM: full    Dental   Pulmonary Current Smoker,    breath sounds clear to auscultation       Cardiovascular negative cardio ROS   Rhythm:regular Rate:Normal     Neuro/Psych    GI/Hepatic   Endo/Other    Renal/GU      Musculoskeletal   Abdominal   Peds  Hematology   Anesthesia Other Findings   Reproductive/Obstetrics                             Anesthesia Physical Anesthesia Plan  ASA: II  Anesthesia Plan: General   Post-op Pain Management:    Induction: Intravenous  Airway Management Planned: LMA  Additional Equipment:   Intra-op Plan:   Post-operative Plan:   Informed Consent: I have reviewed the patients History and Physical, chart, labs and discussed the procedure including the risks, benefits and alternatives for the proposed anesthesia with the patient or authorized representative who has indicated his/her understanding and acceptance.     Plan Discussed with: CRNA, Anesthesiologist and Surgeon  Anesthesia Plan Comments:         Anesthesia Quick Evaluation

## 2016-03-25 NOTE — Brief Op Note (Signed)
03/25/2016  4:44 PM  PATIENT:  Kathrin GreathouseMarcus P Pouncey  30 y.o. male  PRE-OPERATIVE DIAGNOSIS:  Right Hand Abscess   POST-OPERATIVE DIAGNOSIS:  Right Hand Abscess   PROCEDURE:  Procedure(s): RIGHT HAND IRRIGATION AND DEBRIDEMENT (Right)  SURGEON:  Surgeon(s) and Role:    * Betha LoaKevin Yarely Bebee, MD - Primary  PHYSICIAN ASSISTANT:   ASSISTANTS: none   ANESTHESIA:   general  EBL:  Total I/O In: 900 [I.V.:900] Out: 1 [Blood:1]  BLOOD ADMINISTERED:none  DRAINS: iodoform packing  LOCAL MEDICATIONS USED:  MARCAINE     SPECIMEN:  Source of Specimen:  right hand abscess  DISPOSITION OF SPECIMEN:  micro  COUNTS:  YES  TOURNIQUET:   Total Tourniquet Time Documented: Upper Arm (Right) - 30 minutes Total: Upper Arm (Right) - 30 minutes   DICTATION: .Other Dictation: Dictation Number [id number]  PLAN OF CARE: Discharge to home after PACU  PATIENT DISPOSITION:  PACU - hemodynamically stable.

## 2016-03-25 NOTE — H&P (Signed)
Jeff Manning is an 30 y.o. male.   Chief Complaint: right hand infection HPI: 30 yo male s/p open reduction and pin fixation right small finger metacarpal.  Seen with pin tract infection 3 days ago and started on antibiotics.  Has had worsening pain, swelling, and developed erythema.  No fevers, chills, night sweats.  Pain in hand.  Allergies: No Known Allergies  Past Medical History:  Diagnosis Date  . Fracture of metacarpal neck of right hand, closed 01/03/2016  . Stuffy and runny nose 01/12/2016   clear drainage from nose, per pt.    Past Surgical History:  Procedure Laterality Date  . NO PAST SURGERIES    . OPEN REDUCTION INTERNAL FIXATION (ORIF) HAND Right 01/15/2016   Procedure: ORIF right small metacarpal fracture with pinning;  Surgeon: Leanora Cover, MD;  Location: [facility];  Service: Orthopedics;  Laterality: Right;    Family History: History reviewed. No pertinent family history.  Social History:   reports that he has been smoking Cigars.  He has smoked for the past 10.00 years. He has never used smokeless tobacco. He reports that he drinks alcohol. He reports that he does not use drugs.  Medications: Medications Prior to Admission  Medication Sig Dispense Refill  . ibuprofen (ADVIL,MOTRIN) 200 MG tablet Take 800 mg by mouth every 6 (six) hours as needed for headache or moderate pain.    Marland Kitchen sulfamethoxazole-trimethoprim (BACTRIM DS,SEPTRA DS) 800-160 MG tablet Take 1 tablet by mouth 2 (two) times daily.     Marland Kitchen HYDROcodone-acetaminophen (NORCO) 5-325 MG tablet 1-2 tabs po q6 hours prn pain (Patient not taking: Reported on 03/25/2016) 30 tablet 0  . HYDROcodone-acetaminophen (NORCO/VICODIN) 5-325 MG tablet Take 1 tablet by mouth every 6 (six) hours as needed for moderate pain. (Patient not taking: Reported on 03/25/2016) 12 tablet 0    Results for orders placed or performed during the hospital encounter of 03/24/16 (from the past 48 hour(s))   Comprehensive metabolic panel     Status: Abnormal   Collection Time: 03/24/16  7:56 PM  Result Value Ref Range   Sodium 136 135 - 145 mmol/L   Potassium 3.2 (L) 3.5 - 5.1 mmol/L   Chloride 104 101 - 111 mmol/L   CO2 25 22 - 32 mmol/L   Glucose, Bld 96 65 - 99 mg/dL   BUN 13 6 - 20 mg/dL   Creatinine, Ser 1.28 (H) 0.61 - 1.24 mg/dL   Calcium 8.2 (L) 8.9 - 10.3 mg/dL   Total Protein 7.8 6.5 - 8.1 g/dL   Albumin 3.5 3.5 - 5.0 g/dL   AST 21 15 - 41 U/L   ALT 22 17 - 63 U/L   Alkaline Phosphatase 59 38 - 126 U/L   Total Bilirubin 0.3 0.3 - 1.2 mg/dL   GFR calc non Af Amer >60 >60 mL/min   GFR calc Af Amer >60 >60 mL/min    Comment: (NOTE) The eGFR has been calculated using the CKD EPI equation. This calculation has not been validated in all clinical situations. eGFR's persistently <60 mL/min signify possible Chronic Kidney Disease.    Anion gap 7 5 - 15  CBC with Differential     Status: Abnormal   Collection Time: 03/24/16  7:56 PM  Result Value Ref Range   WBC 15.1 (H) 4.0 - 10.5 K/uL   RBC 5.04 4.22 - 5.81 MIL/uL   Hemoglobin 15.2 13.0 - 17.0 g/dL   HCT 43.5 39.0 - 52.0 %   MCV  86.3 78.0 - 100.0 fL   MCH 30.2 26.0 - 34.0 pg   MCHC 34.9 30.0 - 36.0 g/dL   RDW 12.6 11.5 - 15.5 %   Platelets 336 150 - 400 K/uL   Neutrophils Relative % 69 %   Neutro Abs 10.5 (H) 1.7 - 7.7 K/uL   Lymphocytes Relative 19 %   Lymphs Abs 2.8 0.7 - 4.0 K/uL   Monocytes Relative 11 %   Monocytes Absolute 1.6 (H) 0.1 - 1.0 K/uL   Eosinophils Relative 1 %   Eosinophils Absolute 0.1 0.0 - 0.7 K/uL   Basophils Relative 0 %   Basophils Absolute 0.0 0.0 - 0.1 K/uL  Sedimentation rate     Status: Abnormal   Collection Time: 03/24/16  7:56 PM  Result Value Ref Range   Sed Rate 23 (H) 0 - 16 mm/hr  I-Stat CG4 Lactic Acid, ED     Status: None   Collection Time: 03/24/16  8:08 PM  Result Value Ref Range   Lactic Acid, Venous 1.18 0.5 - 1.9 mmol/L  Urinalysis, Routine w reflex microscopic      Status: Abnormal   Collection Time: 03/24/16  9:06 PM  Result Value Ref Range   Color, Urine YELLOW YELLOW   APPearance CLEAR CLEAR   Specific Gravity, Urine 1.034 (H) 1.005 - 1.030   pH 5.0 5.0 - 8.0   Glucose, UA NEGATIVE NEGATIVE mg/dL   Hgb urine dipstick NEGATIVE NEGATIVE   Bilirubin Urine NEGATIVE NEGATIVE   Ketones, ur NEGATIVE NEGATIVE mg/dL   Protein, ur 100 (A) NEGATIVE mg/dL   Nitrite NEGATIVE NEGATIVE   Leukocytes, UA TRACE (A) NEGATIVE   RBC / HPF 0-5 0 - 5 RBC/hpf   WBC, UA 0-5 0 - 5 WBC/hpf   Bacteria, UA NONE SEEN NONE SEEN   Squamous Epithelial / LPF NONE SEEN NONE SEEN   Mucous PRESENT   I-Stat CG4 Lactic Acid, ED     Status: None   Collection Time: 03/24/16  9:33 PM  Result Value Ref Range   Lactic Acid, Venous 1.23 0.5 - 1.9 mmol/L    Dg Hand Complete Right  Result Date: 03/24/2016 CLINICAL DATA:  Right fifth metacarpal fracture, status post pins removal 2 days ago EXAM: RIGHT HAND - COMPLETE 3+ VIEW COMPARISON:  01/08/2016 FINDINGS: Three views of the right hand submitted. There is healing fracture distal aspect of the fifth metacarpal with anatomic alignment. There is subtle cortical lytic lesion mid shaft of fifth metacarpal. Subtle cortical lytic lesion distal shaft of fourth metacarpal. Significant soft tissue swelling metacarpal region. Osteomyelitis cannot be excluded. Clinical correlation is necessary. Further correlation with MRI is recommended. IMPRESSION: There is healing fracture distal aspect of the fifth metacarpal with anatomic alignment. There is subtle cortical lytic lesion mid shaft of fifth metacarpal. Subtle cortical lytic lesion distal shaft of fourth metacarpal. Significant soft tissue swelling metacarpal region. Osteomyelitis cannot be excluded. Clinical correlation is necessary. Further correlation with MRI is recommended. Electronically Signed   By: Lahoma Crocker M.D.   On: 03/24/2016 21:47     A comprehensive review of systems was  negative.  Blood pressure 127/71, pulse 91, temperature 98.1 F (36.7 C), temperature source Oral, resp. rate 18, height '5\' 6"'$  (1.676 m), weight 79.4 kg (175 lb), SpO2 99 %.  General appearance: alert, cooperative and appears stated age Head: Normocephalic, without obvious abnormality, atraumatic Neck: supple, symmetrical, trachea midline Resp: clear to auscultation bilaterally Cardio: regular rate and rhythm GI: non-tender Extremities: Intact  sensation and capillary refill all digits.  +epl/fpl/io.  Pin sited and wound healed.  Swelling and erythema on dorsum of hand.  ttp over erythema.  Able to move digits.  No proximal streaking. Pulses: 2+ and symmetric Skin: Skin color, texture, turgor normal. No rashes or lesions Neurologic: Grossly normal Incision/Wound:none  Assessment/Plan Right hand abscess.  Recommend incision and drainage in OR.  Non operative and operative treatment options were discussed with the patient and patient wishes to proceed with operative treatment. Risks, benefits, and alternatives of surgery were discussed and the patient agrees with the plan of care.   Samuella Rasool R 03/25/2016, 3:22 PM

## 2016-03-25 NOTE — Op Note (Signed)
[id number] 

## 2016-03-25 NOTE — Transfer of Care (Signed)
Immediate Anesthesia Transfer of Care Note  Patient: Jeff GreathouseMarcus P Sutphin  Procedure(s) Performed: Procedure(s): RIGHT HAND IRRIGATION AND DEBRIDEMENT (Right)  Patient Location: PACU  Anesthesia Type:General  Level of Consciousness: awake, alert  and patient cooperative  Airway & Oxygen Therapy: Patient Spontanous Breathing  Post-op Assessment: Report given to RN and Post -op Vital signs reviewed and stable  Post vital signs: Reviewed and stable  Last Vitals:  Vitals:   03/25/16 1407 03/25/16 1653  BP: 127/71 (!) 151/85  Pulse: 91 98  Resp: 18 (!) 24  Temp: 36.7 C 36.7 C    Last Pain:  Vitals:   03/25/16 1653  TempSrc:   PainSc: (P) 0-No pain      Patients Stated Pain Goal: 8 (03/25/16 1359)  Complications: No apparent anesthesia complications

## 2016-03-26 NOTE — Op Note (Signed)
NAMStevenson Clinch:  Twining, Dennies                ACCOUNT NO.:  [id number]  MEDICAL RECORD NO.:  [id number]  LOCATION:                                 FACILITY:  PHYSICIAN:  Betha LoaKevin Zyree Traynham, MD        DATE OF BIRTH:  03-02-1986  DATE OF PROCEDURE:  03/25/2016 DATE OF DISCHARGE:                              OPERATIVE REPORT   PREOPERATIVE DIAGNOSIS:  Right hand abscess.  POSTOPERATIVE DIAGNOSIS:  Right hand abscess and ring finger metacarpal abscess.  PROCEDURE:  Incision and drainage of right hand, subfascial abscess including bony cavity of ring finger metacarpal.  SURGEON:  Betha LoaKevin Veronika Heard, MD  ASSISTANTS:  None.  ANESTHESIA:  General.  IV FLUIDS:  Per Anesthesia flow sheet.  ESTIMATED BLOOD LOSS:  Minimal.  COMPLICATIONS:  None.  SPECIMENS:  Cultures to Micro.  TOURNIQUET TIME:  30 minutes.  DISPOSITION:  Stable to PACU.  INDICATIONS:  Mr. Christell ConstantMoore is a 30 year old male who approximately 2 months ago underwent open reduction and pin fixation of small finger metacarpal fracture.  Returned to the office earlier this week with his pin still in and a swollen hand.  He was started on antibiotics.  He returned today with increasing swelling, pain, and erythema of the hand.  I recommended incision and drainage in the operating room.  Risks, benefits, and alternatives of surgery were discussed including the risk of blood loss; infection; damage to nerves, vessels, tendons, ligaments, bone; failure of surgery, need for additional surgery, complications with wound healing, continued pain, continued infection, need for repeat irrigation and debridement.  He voiced understanding of these risks, elected to proceed.  OPERATIVE COURSE:  After being identified preoperatively by myself, the patient and I agreed upon procedure and site of procedure.  Surgical site was marked.  The risks, benefits, and alternatives of surgery were reviewed and he wished to proceed.  Surgical consent had been  signed. Antibiotics were held for cultures.  He was transferred to the operating room, placed on the operating table in supine position with the right upper extremity on arm board.  General anesthesia was induced by anesthesiologist.  Right upper extremity was prepped and draped in normal sterile orthopedic fashion.  Surgical pause was performed between surgeons, Anesthesia, and operative room staff and all were in agreement as to patient, procedure, and site of procedure.  Tourniquet at the proximal aspect of the extremity was inflated 250 mmHg after exsanguination of the limb with an Esmarch bandage.  An incision was made on the dorsum of the hand in the location of the previous surgical incision.  The wound was carried into subcutaneous tissues by spreading technique.  The area underneath the extensor tendons was spread.  There was gross purulence.  Cultures were taken for aerobes and anaerobes. The cavity was deep to the extensor tendons and between the ring and small finger metacarpals.  It coursed slightly underneath the ring finger metacarpal, but no further.  The hole in the ring finger metacarpal proximally from the pin was identified.  There was no purulence coming from within and the hole was not enlarged.  The distal hole was enlarged.  A House curette was placed into  the hole and the area curetted.  The holes in the small finger metacarpal were also located.  Again, the proximal hole was not enlarged.  The distal hole was somewhat enlarged.  This was opened as well.  No purulence was coming from within.  The abscess cavity was copiously irrigated with sterile saline.  It was then packed with 0.5-inch iodoform gauze.  The wound was injected with 10 mL of 0.25% plain Marcaine to aid in postoperative analgesia.  It was then dressed with sterile 4x4s and wrapped with a Kerlix bandage.  A volar splint was placed and wrapped with Kerlix and Ace bandage.  The patient was given IV  vancomycin after cultures had been taken.  Tourniquet was deflated at 30 minutes. Fingertips were pink with brisk capillary refill after deflation of tourniquet.  Operative drapes were broken down.  Patient was awakened from anesthesia safely.  He was transferred back to stretcher and taken to PACU in stable condition.  I will see him back in the office in approximately 4 days for postoperative followup.  I will give him Norco 5/325, 1-2 p.o. q.6 hours p.r.n. pain, dispensed #30 and we will continue on Bactrim DS 1 p.o. b.i.d. x7 days pending cultures.     Betha LoaKevin Veroncia Jezek, MD     KK/MEDQ  D:  03/25/2016  T:  03/26/2016  Job:  [id number]

## 2016-03-27 ENCOUNTER — Encounter (HOSPITAL_COMMUNITY): Payer: Self-pay | Admitting: Orthopedic Surgery

## 2016-03-29 NOTE — Anesthesia Postprocedure Evaluation (Signed)
Anesthesia Post Note  Patient: Jeff Manning  Procedure(s) Performed: Procedure(s) (LRB): RIGHT HAND IRRIGATION AND DEBRIDEMENT (Right)  Patient location during evaluation: PACU Anesthesia Type: General Level of consciousness: awake and alert and patient cooperative Pain management: pain level controlled Vital Signs Assessment: post-procedure vital signs reviewed and stable Respiratory status: spontaneous breathing and respiratory function stable Cardiovascular status: stable Anesthetic complications: no       Last Vitals:  Vitals:   03/25/16 1823 03/25/16 1838  BP: (!) 141/87 (!) 147/95  Pulse: (!) 107 (!) 106  Resp: 20 20  Temp:      Last Pain:  Vitals:   03/25/16 1753  TempSrc:   PainSc: Asleep                 Olivette Beckmann S

## 2016-03-30 LAB — AEROBIC/ANAEROBIC CULTURE (SURGICAL/DEEP WOUND)

## 2016-03-30 LAB — AEROBIC/ANAEROBIC CULTURE W GRAM STAIN (SURGICAL/DEEP WOUND)

## 2016-06-06 ENCOUNTER — Emergency Department (HOSPITAL_BASED_OUTPATIENT_CLINIC_OR_DEPARTMENT_OTHER)
Admission: EM | Admit: 2016-06-06 | Discharge: 2016-06-06 | Disposition: A | Discharge status: Home / Self Care | Payer: Self-pay | Attending: Emergency Medicine | Admitting: Emergency Medicine

## 2016-06-06 ENCOUNTER — Encounter (HOSPITAL_BASED_OUTPATIENT_CLINIC_OR_DEPARTMENT_OTHER): Payer: Self-pay | Admitting: *Deleted

## 2016-06-06 DIAGNOSIS — F17210 Nicotine dependence, cigarettes, uncomplicated: Secondary | ICD-10-CM | POA: Insufficient documentation

## 2016-06-06 DIAGNOSIS — Z202 Contact with and (suspected) exposure to infections with a predominantly sexual mode of transmission: Secondary | ICD-10-CM | POA: Insufficient documentation

## 2016-06-06 LAB — URINALYSIS, ROUTINE W REFLEX MICROSCOPIC
Bilirubin Urine: NEGATIVE
GLUCOSE, UA: NEGATIVE mg/dL
Hgb urine dipstick: NEGATIVE
Ketones, ur: NEGATIVE mg/dL
Leukocytes, UA: NEGATIVE
Nitrite: NEGATIVE
PH: 6.5 (ref 5.0–8.0)
Protein, ur: NEGATIVE mg/dL
SPECIFIC GRAVITY, URINE: 1.029 (ref 1.005–1.030)

## 2016-06-06 MED ORDER — METRONIDAZOLE 500 MG PO TABS: 500.0000 mg | ORAL_TABLET | Freq: Two times a day (BID) | ORAL | 0 refills | Status: DC

## 2016-06-06 MED FILL — metroNIDAZOLE 500 MG TABS: 500 | 7 days supply | Qty: 14 | Fill #0

## 2016-06-06 NOTE — ED Triage Notes (Signed)
Wants to be treated for an STD. States he may have inherited trichomoniasis.

## 2016-06-06 NOTE — ED Provider Notes (Signed)
[facility]   CSN: [id number] Arrival date & time: 06/06/16  1253     History   Chief Complaint Chief Complaint  Patient presents with  . SEXUALLY TRANSMITTED DISEASE    HPI Jeff Manning is a 30 y.o. male.  HPI Patient with reported exposure to trichomonas. Patient is without symptoms. No dysuria. No penile discharge. No fevers or chills. He has had unprotected sex with that woman and another woman. No rash.   Past Medical History:  Diagnosis Date  . Fracture of metacarpal neck of right hand, closed 01/03/2016  . Stuffy and runny nose 01/12/2016   clear drainage from nose, per pt.    Patient Active Problem List   Diagnosis Date Noted  . 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 I&D EXTREMITY Right 03/25/2016   Procedure: RIGHT HAND IRRIGATION AND DEBRIDEMENT;  Surgeon: Betha Loa, MD;  Location: [facility];  Service: Orthopedics;  Laterality: Right;  . NO PAST SURGERIES    . OPEN REDUCTION INTERNAL FIXATION (ORIF) HAND Right 01/15/2016   Procedure: ORIF right small metacarpal fracture with pinning;  Surgeon: Betha Loa, MD;  Location: [facility];  Service: Orthopedics;  Laterality: Right;       Home Medications    Prior to Admission medications   Medication Sig Start Date End Date Taking? Authorizing Provider  HYDROcodone-acetaminophen (NORCO) 5-325 MG tablet 1-2 tabs po q6 hours prn pain 03/25/16   Betha Loa, MD  ibuprofen (ADVIL,MOTRIN) 200 MG tablet Take 800 mg by mouth every 6 (six) hours as needed for headache or moderate pain.    Historical Provider, MD  metroNIDAZOLE (FLAGYL) 500 MG tablet Take 1 tablet (500 mg total) by mouth 2 (two) times daily. 06/06/16   Benjiman Core, MD  sulfamethoxazole-trimethoprim (BACTRIM DS) 800-160 MG tablet Take 1 tablet by mouth 2 (two) times daily. 03/25/16   Betha Loa, MD  sulfamethoxazole-trimethoprim  (BACTRIM DS,SEPTRA DS) 800-160 MG tablet Take 1 tablet by mouth 2 (two) times daily.  03/22/16   Historical Provider, MD    Family History No family history on file.  Social History Social History  Substance Use Topics  . Smoking status: Current Every Day Smoker    Years: 10.00    Types: Cigars  . Smokeless tobacco: Never Used     Comment: 1-2 Black and Mild/day  . Alcohol use Yes     Comment: occasionally     Allergies   Patient has no known allergies.   Review of Systems Review of Systems  Constitutional: Negative for activity change, appetite change and fever.  HENT: Negative for sore throat.   Eyes: Negative for pain.  Respiratory: Negative for chest tightness and shortness of breath.   Cardiovascular: Negative for chest pain.  Gastrointestinal: Negative for abdominal pain, diarrhea, nausea and vomiting.  Genitourinary: Negative for difficulty urinating, discharge, dysuria, flank pain, penile pain and penile swelling.  Musculoskeletal: Negative for back pain.  Skin: Negative for rash.  Neurological: Negative for weakness.     Physical Exam Updated Vital Signs BP 134/86   Pulse 61   Temp 98.4 F (36.9 C) (Oral)   Resp 20   Ht 5\' 6"  (1.676 m)   Wt 178 lb (80.7 kg)   SpO2 99%   BMI 28.73 kg/m   Physical Exam  Constitutional: He appears well-developed.  HENT:  Head: Atraumatic.  Neck: Neck supple.  Abdominal: There is  no tenderness.  Genitourinary: Penis normal.  Genitourinary Comments: No penile discharge or lesion. Suprapubic area does have 2 small areas that appear to be healing potential small abscesses. States he has had these for years from his belt..  Neurological: He is alert.  Skin: Skin is warm. Capillary refill takes less than 2 seconds.     ED Treatments / Results  Labs (all labs ordered are listed, but only abnormal results are displayed) Labs Reviewed  URINALYSIS, ROUTINE W REFLEX MICROSCOPIC  RPR  HIV ANTIBODY (ROUTINE TESTING)    GC/CHLAMYDIA PROBE AMP ([place]    EKG  EKG Interpretation None       Radiology No results found.  Procedures Procedures (including critical care time)  Medications Ordered in ED Medications - No data to display   Initial Impression / Assessment and Plan / ED Course  I have reviewed the triage vital signs and the nursing notes.  Pertinent labs & imaging results that were available during my care of the patient were reviewed by me and considered in my medical decision making (see chart for details).     Patient with reported exposure to trichomonas. GC chlamydia HIV and syphilis sent. Discharge home with metronidazole.  Final Clinical Impressions(s) / ED Diagnoses   Final diagnoses:  Trichomonas exposure    New Prescriptions Discharge Medication List as of 06/06/2016  1:53 PM    START taking these medications   Details  metroNIDAZOLE (FLAGYL) 500 MG tablet Take 1 tablet (500 mg total) by mouth 2 (two) times daily., Starting Mon 06/06/2016, Print         Benjiman CoreNathan Rayneisha Bouza, MD 06/06/16 1425

## 2016-06-07 LAB — GC/CHLAMYDIA PROBE AMP (~~LOC~~) NOT AT ARMC
Chlamydia: NEGATIVE
NEISSERIA GONORRHEA: POSITIVE — AB

## 2016-06-07 LAB — HIV ANTIBODY (ROUTINE TESTING W REFLEX): HIV Screen 4th Generation wRfx: NONREACTIVE

## 2016-06-07 LAB — RPR: RPR: NONREACTIVE

## 2016-12-15 ENCOUNTER — Emergency Department (HOSPITAL_COMMUNITY): Payer: Self-pay

## 2016-12-15 ENCOUNTER — Encounter (HOSPITAL_COMMUNITY): Payer: Self-pay | Admitting: Emergency Medicine

## 2016-12-15 ENCOUNTER — Emergency Department (HOSPITAL_COMMUNITY)
Admission: EM | Admit: 2016-12-15 | Discharge: 2016-12-15 | Disposition: A | Discharge status: Home / Self Care | Payer: Self-pay | Attending: Emergency Medicine | Admitting: Emergency Medicine

## 2016-12-15 DIAGNOSIS — Z79899 Other long term (current) drug therapy: Secondary | ICD-10-CM | POA: Insufficient documentation

## 2016-12-15 DIAGNOSIS — F17290 Nicotine dependence, other tobacco product, uncomplicated: Secondary | ICD-10-CM | POA: Insufficient documentation

## 2016-12-15 DIAGNOSIS — R110 Nausea: Secondary | ICD-10-CM | POA: Insufficient documentation

## 2016-12-15 DIAGNOSIS — R059 Cough, unspecified: Secondary | ICD-10-CM

## 2016-12-15 DIAGNOSIS — R05 Cough: Secondary | ICD-10-CM | POA: Insufficient documentation

## 2016-12-15 MED ORDER — ONDANSETRON 4 MG PO TBDP
4.0000 mg | ORAL_TABLET | Freq: Once | ORAL | Status: AC
  Administered 2016-12-15: 4 mg via ORAL
  Filled 2016-12-15: qty 1

## 2016-12-15 MED ORDER — ONDANSETRON 4 MG PO TBDP: 4.0000 mg | ORAL_TABLET | Freq: Three times a day (TID) | ORAL | 0 refills | Status: AC | PRN

## 2016-12-15 MED ORDER — ACETAMINOPHEN 325 MG PO TABS
650.0000 mg | ORAL_TABLET | Freq: Once | ORAL | Status: AC
  Administered 2016-12-15: 650 mg via ORAL
  Filled 2016-12-15: qty 2

## 2016-12-15 MED ORDER — PROMETHAZINE HCL 25 MG PO TABS
25.0000 mg | ORAL_TABLET | Freq: Once | ORAL | Status: AC
  Administered 2016-12-15: 25 mg via ORAL
  Filled 2016-12-15: qty 1

## 2016-12-15 NOTE — ED Triage Notes (Addendum)
Pt vomited x 2 after sipping on water

## 2016-12-15 NOTE — ED Triage Notes (Signed)
Pt complaint of cough worsening yesterday; noted blood with cough; hx of bronchitis.

## 2016-12-15 NOTE — Discharge Instructions (Signed)
Return if any problems.

## 2016-12-15 NOTE — ED Provider Notes (Signed)
[facility]-EMERGENCY DEPT Provider Note   CSN: [id number] Arrival date & time: 12/15/16  1455     History   Chief Complaint Chief Complaint  Patient presents with  . Cough    HPI Jeff Manning is a 31 y.o. male.  The history is provided by the patient. No language interpreter was used.  Cough  This is a new problem. The current episode started yesterday. The problem occurs constantly. The problem has been gradually worsening. The cough is non-productive. There has been no fever. He has tried nothing for the symptoms. The treatment provided no relief. He is not a smoker. His past medical history is significant for bronchitis.  Pt reports he has had a cough and congestion.  Pt reports nausea today.  Pt feels nauseated with coughing.    Past Medical History:  Diagnosis Date  . Fracture of metacarpal neck of right hand, closed 01/03/2016  . Stuffy and runny nose 01/12/2016   clear drainage from nose, per pt.    Patient Active Problem List   Diagnosis Date Noted  . 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 I&D EXTREMITY Right 03/25/2016   Procedure: RIGHT HAND IRRIGATION AND DEBRIDEMENT;  Surgeon: Betha Loa, MD;  Location: [facility] OR;  Service: Orthopedics;  Laterality: Right;  . NO PAST SURGERIES    . OPEN REDUCTION INTERNAL FIXATION (ORIF) HAND Right 01/15/2016   Procedure: ORIF right small metacarpal fracture with pinning;  Surgeon: Betha Loa, MD;  Location: [facility];  Service: Orthopedics;  Laterality: Right;       Home Medications    Prior to Admission medications   Medication Sig Start Date End Date Taking? Authorizing Provider  HYDROcodone-acetaminophen (NORCO) 5-325 MG tablet 1-2 tabs po q6 hours prn pain 03/25/16   Betha Loa, MD  ibuprofen (ADVIL,MOTRIN) 200 MG tablet Take 800 mg by mouth every 6 (six) hours as needed for headache or moderate pain.    [provider]    metroNIDAZOLE (FLAGYL) 500 MG tablet Take 1 tablet (500 mg total) by mouth 2 (two) times daily. 06/06/16   Benjiman Core, MD  ondansetron (ZOFRAN ODT) 4 MG disintegrating tablet Take 1 tablet (4 mg total) by mouth every 8 (eight) hours as needed for nausea or vomiting. 12/15/16   Elson Areas, PA-C  sulfamethoxazole-trimethoprim (BACTRIM DS) 800-160 MG tablet Take 1 tablet by mouth 2 (two) times daily. 03/25/16   Betha Loa, MD  sulfamethoxazole-trimethoprim (BACTRIM DS,SEPTRA DS) 800-160 MG tablet Take 1 tablet by mouth 2 (two) times daily.  03/22/16   [provider]    Family History No family history on file.  Social History Social History  Substance Use Topics  . Smoking status: Current Every Day Smoker    Years: 10.00    Types: Cigars  . Smokeless tobacco: Never Used     Comment: 1-2 Black and Mild/day  . Alcohol use Yes     Comment: occasionally     Allergies   Patient has no known allergies.   Review of Systems Review of Systems  Respiratory: Positive for cough.   All other systems reviewed and are negative.    Physical Exam Updated Vital Signs BP (!) 144/100 (BP Location: Right Arm)   Pulse 71   Temp 97.6 F (36.4 C) (Oral)   Resp 18   Ht  (1.676 m)   Wt 82.6 kg (182 lb)   SpO2 99%  BMI 29.38 kg/m   Physical Exam  Constitutional: He appears well-developed and well-nourished.  HENT:  Head: Normocephalic and atraumatic.  Right Ear: External ear normal.  Left Ear: External ear normal.  Nose: Nose normal.  Mouth/Throat: Oropharynx is clear and moist.  Eyes: Conjunctivae are normal.  Neck: Neck supple.  Cardiovascular: Normal rate and regular rhythm.   No murmur heard. Pulmonary/Chest: Effort normal and breath sounds normal. No respiratory distress.  Abdominal: Soft. There is no tenderness.  Musculoskeletal: He exhibits no edema.  Neurological: He is alert.  Skin: Skin is warm and dry.  Psychiatric: He has a normal mood and  affect.  Nursing note and vitals reviewed.    ED Treatments / Results  Labs (all labs ordered are listed, but only abnormal results are displayed) Labs Reviewed - No data to display  EKG  EKG Interpretation None       Radiology Dg Chest 2 View  Result Date: 12/15/2016 CLINICAL DATA:  Cough, fever EXAM: CHEST  2 VIEW COMPARISON:  03/11/2015 FINDINGS: Heart and mediastinal contours are within normal limits. No focal opacities or effusions. No acute bony abnormality. IMPRESSION: No active cardiopulmonary disease. Electronically Signed   By: Charlett Nose M.D.   On: 12/15/2016 15:24   Chest xray normal.  Pt given tylenol and zofran.  Pt feels better.  Pt able to tolerate fluids.  I suspect viral illness  Procedures Procedures (including critical care time)  Medications Ordered in ED Medications  ondansetron (ZOFRAN-ODT) disintegrating tablet 4 mg (4 mg Oral Given 12/15/16 1928)  acetaminophen (TYLENOL) tablet 650 mg (650 mg Oral Given 12/15/16 1926)     Initial Impression / Assessment and Plan / ED Course  I have reviewed the triage vital signs and the nursing notes.  Pertinent labs & imaging results that were available during my care of the patient were reviewed by me and considered in my medical decision making (see chart for details).       Final Clinical Impressions(s) / ED Diagnoses   Final diagnoses:  Cough  Nausea    New Prescriptions New Prescriptions   ONDANSETRON (ZOFRAN ODT) 4 MG DISINTEGRATING TABLET    Take 1 tablet (4 mg total) by mouth every 8 (eight) hours as needed for nausea or vomiting.  An After Visit Summary was printed and given to the patient.   [name] 12/15/16 2009    Lavera Guise, MD 12/16/16 [phone]

## 2017-07-12 IMAGING — DX DG CHEST 2V
2 series · 2 of 2 positions shown · non-contrast
Comparison: 08/23/2014

CLINICAL DATA: Difficulty breathing for 10 days. Nasal congestion,
shortness of breath with cough. Ex-smoker for several months.

EXAM:
CHEST  2 VIEW

[chest pa]
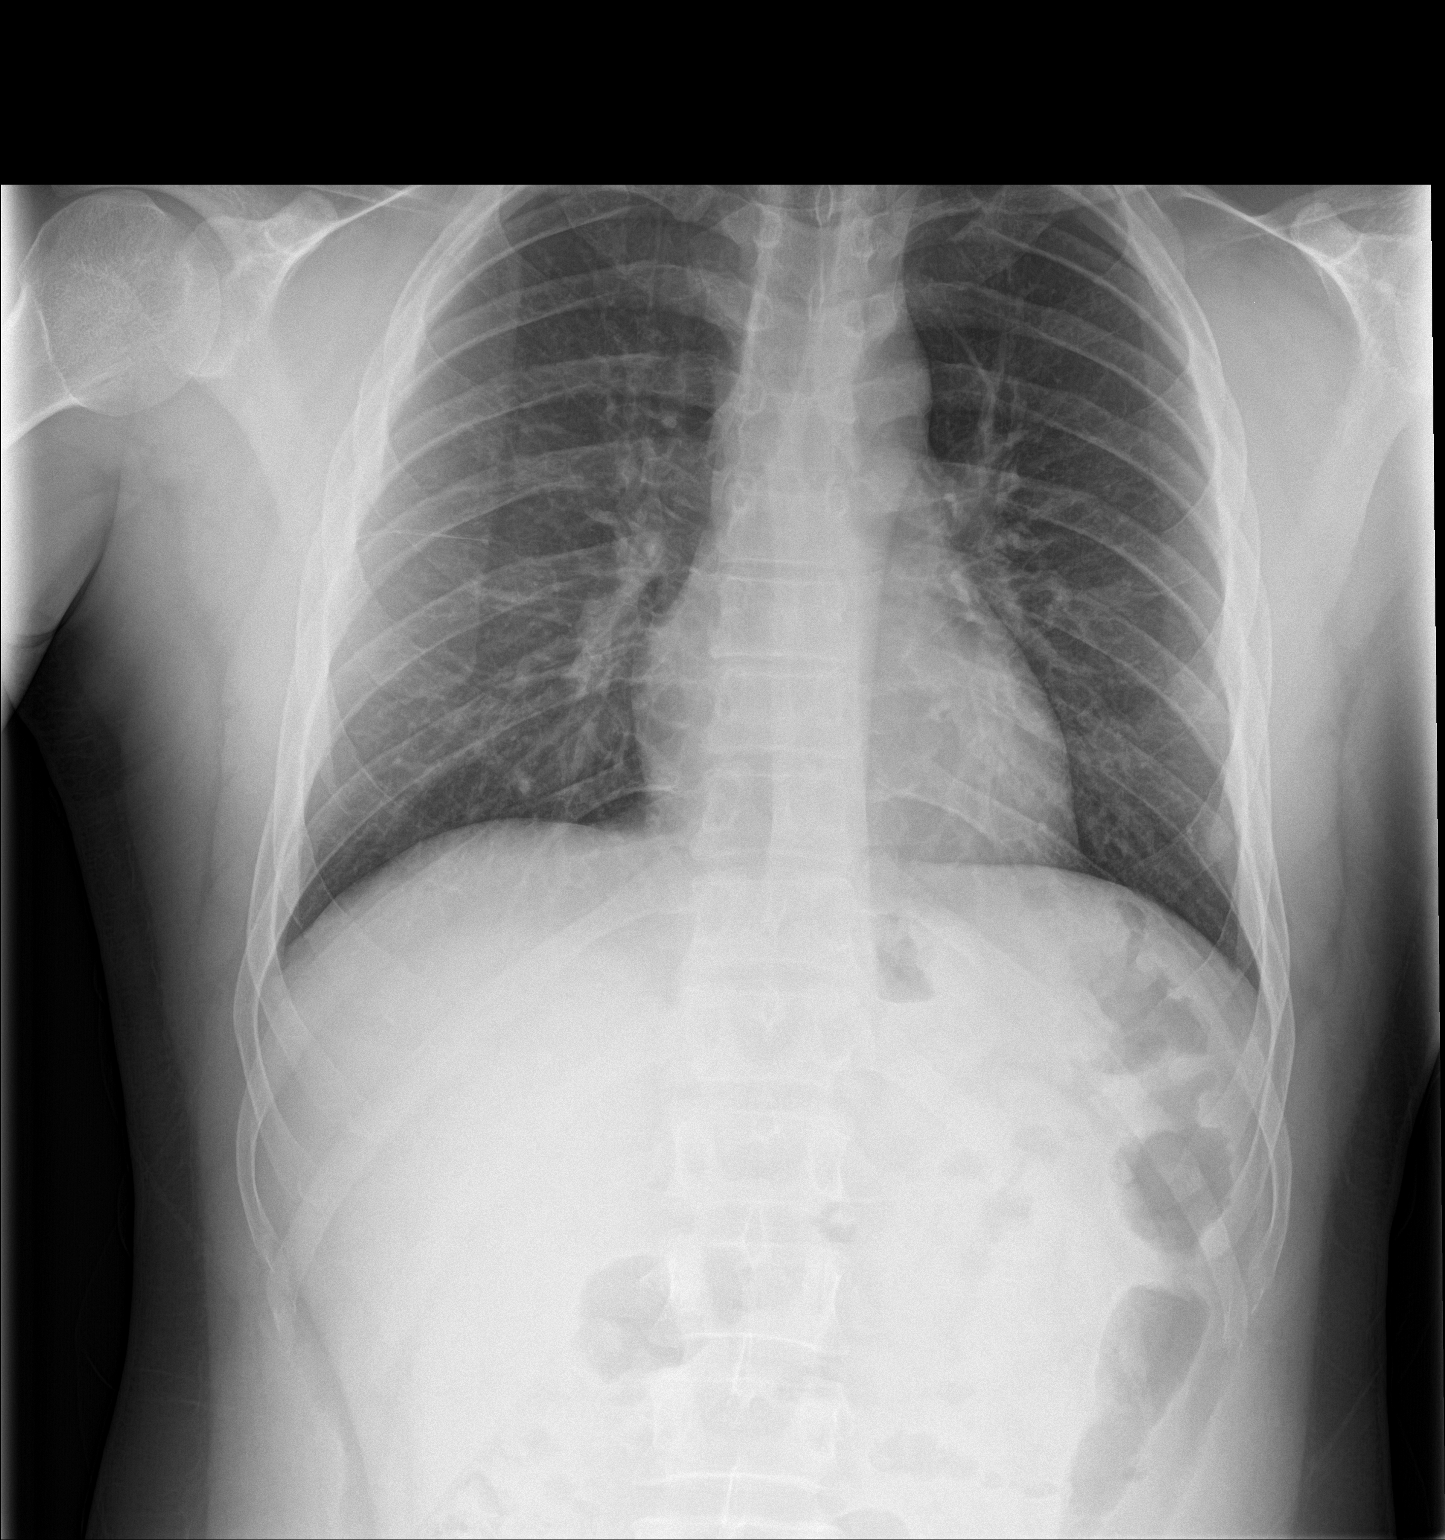

[chest lat]
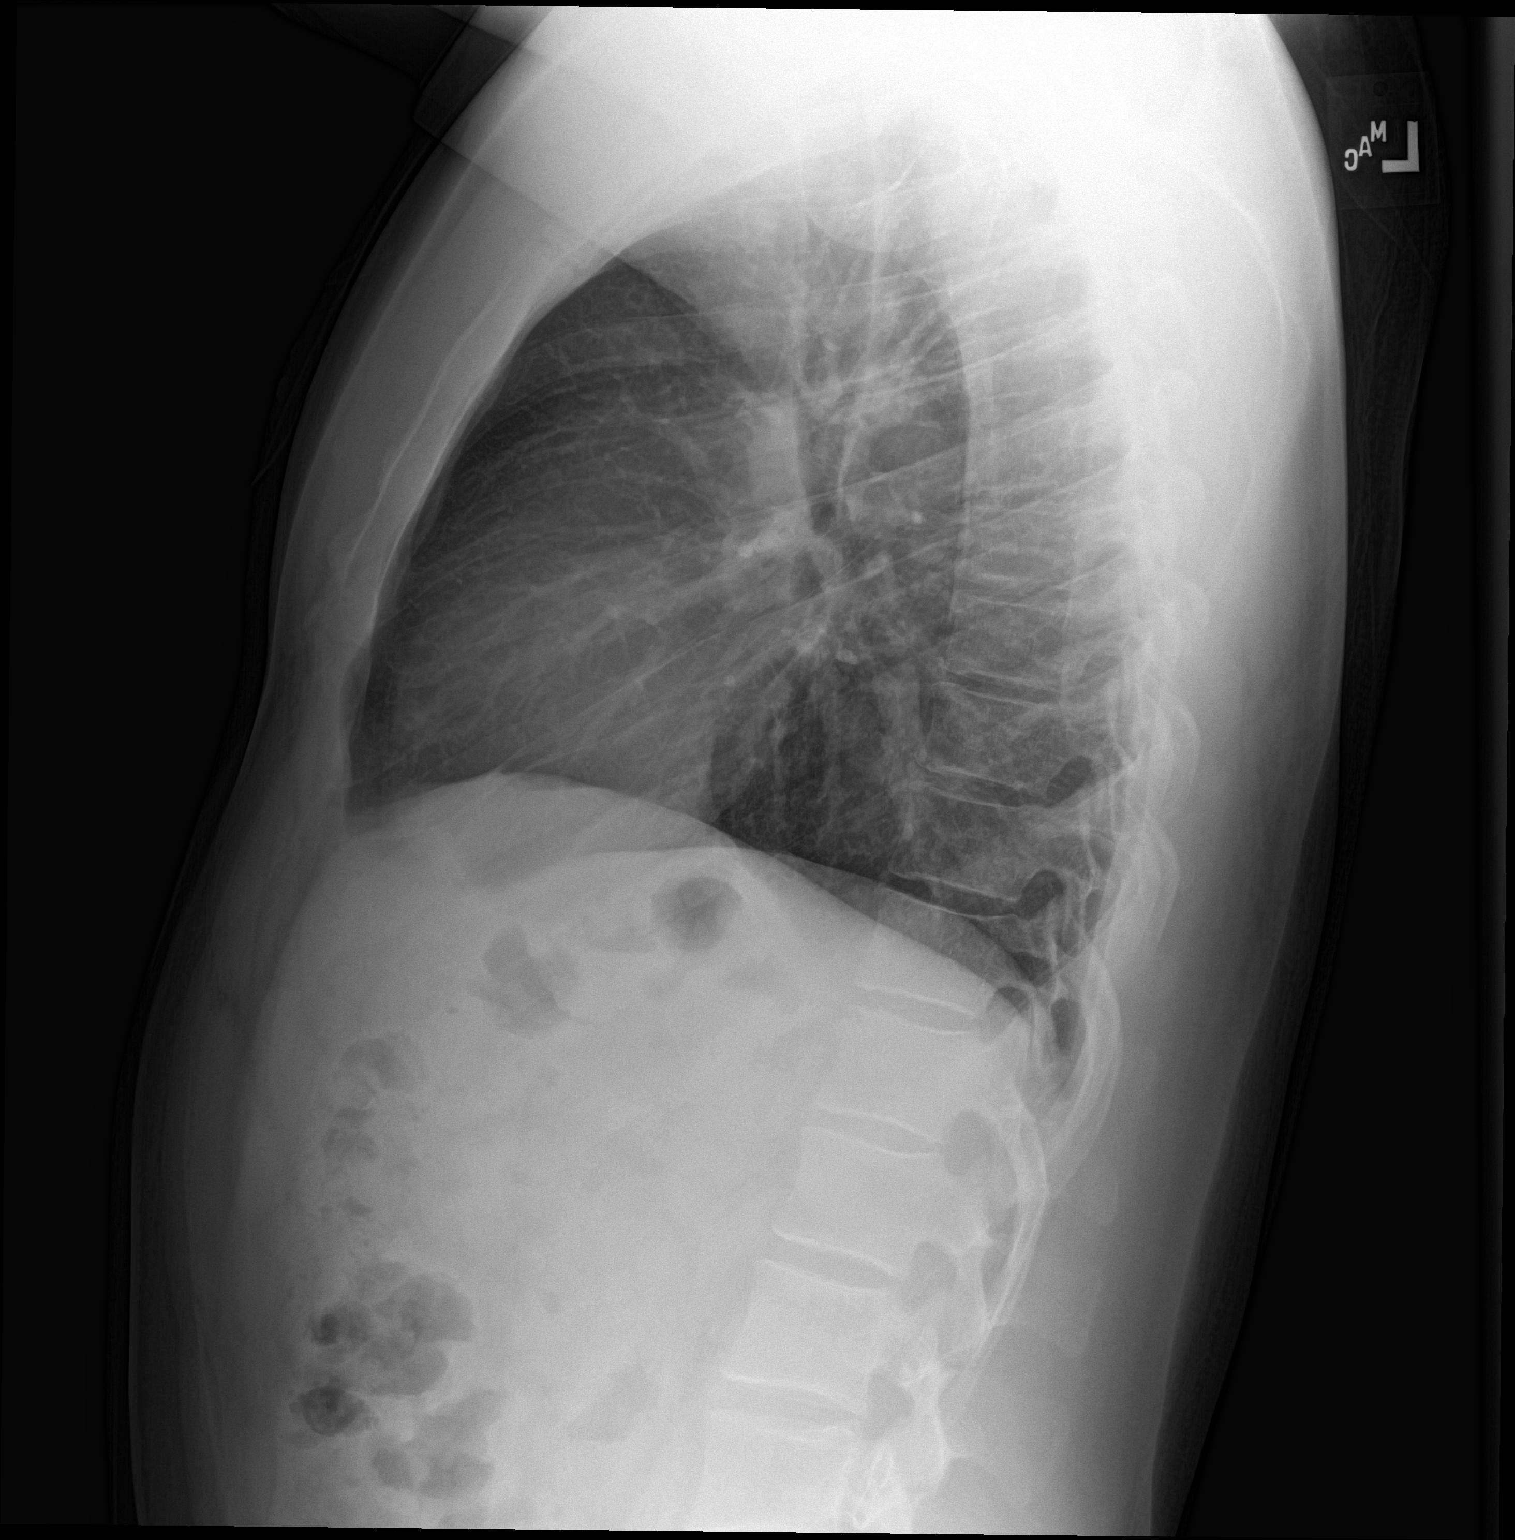

[2 of 2 positions shown; findings below may reference images not displayed]

FINDINGS: Normal heart size and pulmonary vascularity. No focal airspace
disease or consolidation in the lungs. No blunting of costophrenic
angles. No pneumothorax. Mediastinal contours appear intact.
IMPRESSION: No active cardiopulmonary disease.

## 2020-08-22 ENCOUNTER — Emergency Department (HOSPITAL_BASED_OUTPATIENT_CLINIC_OR_DEPARTMENT_OTHER)
Admission: EM | Admit: 2020-08-22 | Discharge: 2020-08-22 | Disposition: A | Discharge status: Home / Self Care | Payer: Self-pay | Attending: Emergency Medicine | Admitting: Emergency Medicine

## 2020-08-22 ENCOUNTER — Other Ambulatory Visit: Payer: Self-pay

## 2020-08-22 ENCOUNTER — Encounter (HOSPITAL_BASED_OUTPATIENT_CLINIC_OR_DEPARTMENT_OTHER): Payer: Self-pay | Admitting: Urology

## 2020-08-22 DIAGNOSIS — Z87891 Personal history of nicotine dependence: Secondary | ICD-10-CM | POA: Insufficient documentation

## 2020-08-22 DIAGNOSIS — Z202 Contact with and (suspected) exposure to infections with a predominantly sexual mode of transmission: Secondary | ICD-10-CM | POA: Insufficient documentation

## 2020-08-22 LAB — URINALYSIS, ROUTINE W REFLEX MICROSCOPIC
Bilirubin Urine: NEGATIVE
Glucose, UA: NEGATIVE mg/dL
Hgb urine dipstick: NEGATIVE
Ketones, ur: NEGATIVE mg/dL
Leukocytes,Ua: NEGATIVE
Nitrite: NEGATIVE
Protein, ur: NEGATIVE mg/dL
Specific Gravity, Urine: 1.015 (ref 1.005–1.030)
pH: 7 (ref 5.0–8.0)

## 2020-08-22 LAB — HIV ANTIBODY (ROUTINE TESTING W REFLEX): HIV Screen 4th Generation wRfx: NONREACTIVE

## 2020-08-22 MED ORDER — DOXYCYCLINE HYCLATE 100 MG PO CAPS: 100.0000 mg | ORAL_CAPSULE | Freq: Two times a day (BID) | ORAL | 0 refills | Status: AC

## 2020-08-22 MED ORDER — CEFTRIAXONE SODIUM 500 MG IJ SOLR
500.0000 mg | Freq: Once | INTRAMUSCULAR | Status: AC
  Administered 2020-08-22: 500 mg via INTRAMUSCULAR
  Filled 2020-08-22: qty 500

## 2020-08-22 NOTE — ED Triage Notes (Signed)
Partner tested pos for Chlamydia 4 days ago, pt wants to be tested,  denies any penile discharge, denies any pain.

## 2020-08-22 NOTE — ED Provider Notes (Signed)
[facility] EMERGENCY DEPARTMENT Provider Note   CSN: [id number] Arrival date & time: 08/22/20  0830     History Chief Complaint  Patient presents with  . Exposure to STD    Jeff Manning is a 35 y.o. male.  Patient is a 35 year old male who presents with exposure to chlamydia.  He states he is sexually active with his girlfriend who recently tested positive for chlamydia.  He currently denies any symptoms.  No penile discharge.  No burning on urination.  He request to be tested.  He denies any fevers or abdominal pain.  No nausea or vomiting.        Past Medical History:  Diagnosis Date  . Fracture of metacarpal neck of right hand, closed 01/03/2016  . Stuffy and runny nose 01/12/2016   clear drainage from nose, per pt.    Patient Active Problem List   Diagnosis Date Noted  . 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 I & D EXTREMITY Right 03/25/2016   Procedure: RIGHT HAND IRRIGATION AND DEBRIDEMENT;  Surgeon: Betha Loa, MD;  Location: [facility] OR;  Service: Orthopedics;  Laterality: Right;  . NO PAST SURGERIES    . OPEN REDUCTION INTERNAL FIXATION (ORIF) HAND Right 01/15/2016   Procedure: ORIF right small metacarpal fracture with pinning;  Surgeon: Betha Loa, MD;  Location: [facility];  Service: Orthopedics;  Laterality: Right;       History reviewed. No pertinent family history.  Social History   Tobacco Use  . Smoking status: Former Smoker    Years: 10.00    Types: Cigars  . Smokeless tobacco: Never Used  . Tobacco comment: 1-2 Black and Mild/day  Substance Use Topics  . Alcohol use: Yes    Comment: occasionally  . Drug use: No    Home Medications Prior to Admission medications   Medication Sig Start Date End Date Taking? Authorizing Provider  doxycycline (VIBRAMYCIN) 100 MG capsule Take 1 capsule (100 mg total) by mouth 2 (two) times daily. One po bid x 7  days 08/22/20  Yes Rolan Bucco, MD  HYDROcodone-acetaminophen [facility]) 5-325 MG tablet 1-2 tabs po q6 hours prn pain 03/25/16   Betha Loa, MD  ibuprofen (ADVIL,MOTRIN) 200 MG tablet Take 800 mg by mouth every 6 (six) hours as needed for headache or moderate pain.    [provider]  ondansetron (ZOFRAN ODT) 4 MG disintegrating tablet Take 1 tablet (4 mg total) by mouth every 8 (eight) hours as needed for nausea or vomiting. 12/15/16   Elson Areas, PA-C    Allergies    Patient has no known allergies.  Review of Systems   Review of Systems  Constitutional: Negative for chills, diaphoresis, fatigue and fever.  HENT: Negative for congestion, rhinorrhea and sneezing.   Eyes: Negative.   Respiratory: Negative for cough, chest tightness and shortness of breath.   Cardiovascular: Negative for chest pain and leg swelling.  Gastrointestinal: Negative for abdominal pain, blood in stool, diarrhea, nausea and vomiting.  Genitourinary: Negative for difficulty urinating, flank pain, frequency, hematuria, penile discharge, penile pain, scrotal swelling and testicular pain.  Musculoskeletal: Negative for arthralgias and back pain.  Skin: Negative for rash.  Neurological: Negative for dizziness, speech difficulty, weakness, numbness and headaches.    Physical Exam Updated Vital Signs BP (!) 138/94 (BP Location: Right Arm)   Pulse 63   Temp 98.1 F (36.7 C) (Oral)  Resp 18   Ht 5\' 7"  (1.702 m)   Wt 77.1 kg   SpO2 98%   BMI 26.63 kg/m   Physical Exam Constitutional:      Appearance: He is well-developed.  HENT:     Head: Normocephalic and atraumatic.  Eyes:     Pupils: Pupils are equal, round, and reactive to light.  Cardiovascular:     Rate and Rhythm: Normal rate and regular rhythm.     Heart sounds: Normal heart sounds.  Pulmonary:     Effort: Pulmonary effort is normal. No respiratory distress.     Breath sounds: Normal breath sounds. No wheezing or rales.  Chest:      Chest wall: No tenderness.  Abdominal:     General: Bowel sounds are normal.     Palpations: Abdomen is soft.     Tenderness: There is no abdominal tenderness. There is no guarding or rebound.  Genitourinary:    Comments: Normal external male genitalia, circumcised penis, no discharge, no lesions noted Musculoskeletal:        General: Normal range of motion.     Cervical back: Normal range of motion and neck supple.  Lymphadenopathy:     Cervical: No cervical adenopathy.  Skin:    General: Skin is warm and dry.     Findings: No rash.  Neurological:     Mental Status: He is alert and oriented to person, place, and time.     ED Results / Procedures / Treatments   Labs (all labs ordered are listed, but only abnormal results are displayed) Labs Reviewed  RPR  URINALYSIS, ROUTINE W REFLEX MICROSCOPIC  HIV ANTIBODY (ROUTINE TESTING W REFLEX)  GC/CHLAMYDIA PROBE AMP ([place]) NOT AT [facility]    EKG None  Radiology No results found.  Procedures Procedures   Medications Ordered in ED Medications  cefTRIAXone (ROCEPHIN) injection 500 mg (has no administration in time range)    ED Course  I have reviewed the triage vital signs and the nursing notes.  Pertinent labs & imaging results that were available during my care of the patient were reviewed by me and considered in my medical decision making (see chart for details).    MDM Rules/Calculators/A&P                          Patient with recent exposure to chlamydia.  He was tested for STDs which are pending.  He was treated with Rocephin and doxycycline.  Return precautions were given. Final Clinical Impression(s) / ED Diagnoses Final diagnoses:  STD exposure    Rx / DC Orders ED Discharge Orders         Ordered    doxycycline (VIBRAMYCIN) 100 MG capsule  2 times daily        08/22/20 1014           08/24/20, MD 08/22/20 1016

## 2020-08-23 LAB — RPR: RPR Ser Ql: NONREACTIVE

## 2020-08-25 LAB — GC/CHLAMYDIA PROBE AMP (~~LOC~~) NOT AT ARMC
Chlamydia: POSITIVE — AB
Comment: NEGATIVE
Comment: NORMAL
Neisseria Gonorrhea: NEGATIVE

## 2022-01-31 ENCOUNTER — Emergency Department (HOSPITAL_BASED_OUTPATIENT_CLINIC_OR_DEPARTMENT_OTHER)
Admission: EM | Admit: 2022-01-31 | Discharge: 2022-01-31 | Discharge status: Against Medical Advice | Payer: Self-pay | Attending: Emergency Medicine | Admitting: Emergency Medicine

## 2022-01-31 ENCOUNTER — Other Ambulatory Visit: Payer: Self-pay

## 2022-01-31 ENCOUNTER — Encounter (HOSPITAL_BASED_OUTPATIENT_CLINIC_OR_DEPARTMENT_OTHER): Payer: Self-pay | Admitting: Emergency Medicine

## 2022-01-31 ENCOUNTER — Emergency Department (HOSPITAL_BASED_OUTPATIENT_CLINIC_OR_DEPARTMENT_OTHER)
Admission: EM | Admit: 2022-01-31 | Discharge: 2022-02-01 | Disposition: A | Discharge status: Home / Self Care | Payer: Self-pay | Attending: Emergency Medicine | Admitting: Emergency Medicine

## 2022-01-31 DIAGNOSIS — H5711 Ocular pain, right eye: Secondary | ICD-10-CM | POA: Insufficient documentation

## 2022-01-31 DIAGNOSIS — H16001 Unspecified corneal ulcer, right eye: Secondary | ICD-10-CM | POA: Insufficient documentation

## 2022-01-31 DIAGNOSIS — Z5321 Procedure and treatment not carried out due to patient leaving prior to being seen by health care provider: Secondary | ICD-10-CM | POA: Insufficient documentation

## 2022-01-31 DIAGNOSIS — R21 Rash and other nonspecific skin eruption: Secondary | ICD-10-CM | POA: Insufficient documentation

## 2022-01-31 MED ORDER — VALACYCLOVIR HCL 1 G PO TABS: 1000.0000 mg | ORAL_TABLET | Freq: Three times a day (TID) | ORAL | 0 refills | Status: AC

## 2022-01-31 MED ORDER — TETRACAINE HCL 0.5 % OP SOLN
2.0000 [drp] | Freq: Once | OPHTHALMIC | Status: AC
  Administered 2022-01-31: 2 [drp] via OPHTHALMIC
  Filled 2022-01-31: qty 4

## 2022-01-31 MED ORDER — OXYCODONE-ACETAMINOPHEN 5-325 MG PO TABS: 1.0000 | ORAL_TABLET | ORAL | 0 refills | Status: AC | PRN

## 2022-01-31 MED ORDER — FLUORESCEIN SODIUM 1 MG OP STRP
1.0000 | ORAL_STRIP | Freq: Once | OPHTHALMIC | Status: AC
  Administered 2022-01-31: 1 via OPHTHALMIC
  Filled 2022-01-31: qty 1

## 2022-01-31 MED ORDER — OFLOXACIN 0.3 % OP SOLN: 1.0000 [drp] | Freq: Four times a day (QID) | OPHTHALMIC | 0 refills | Status: AC

## 2022-01-31 NOTE — ED Triage Notes (Signed)
Patient presents to ED via POV from home. Here with right eye pain. Reports foreign body sensation but denies event where he could of gotten something in his eye. Redness noted.

## 2022-01-31 NOTE — ED Provider Notes (Signed)
[facility] Provider Note   CSN: [id number] Arrival date & time: 01/31/22  2033     History  Chief Complaint  Patient presents with   Eye Problem    Jeff Manning is a 36 y.o. male.  The history is provided by the patient and medical records. No language interpreter was used.  Eye Problem Location:  Right eye Quality:  Aching, foreign body sensation, sharp, stinging and tearing Severity:  Severe Onset quality:  Gradual Duration:  2 days Timing:  Constant Progression:  Waxing and waning Chronicity:  New Relieved by:  Nothing Worsened by:  Nothing Ineffective treatments:  None tried Associated symptoms: redness and tearing   Associated symptoms: no blurred vision, no decreased vision, no discharge, no double vision, no facial rash, no headaches, no itching, no nausea, no numbness, no photophobia, no swelling, no vomiting and no weakness   Risk factors: no conjunctival hemorrhage, no exposure to pinkeye, no previous injury to eye and no recent herpes zoster        Home Medications Prior to Admission medications   Medication Sig Start Date End Date Taking? Authorizing Provider  doxycycline (VIBRAMYCIN) 100 MG capsule Take 1 capsule (100 mg total) by mouth 2 (two) times daily. One po bid x 7 days 08/22/20   Jeff Johns, MD  HYDROcodone-acetaminophen [facility]) 5-325 MG tablet 1-2 tabs po q6 hours prn pain 03/25/16   Jeff Cover, MD  ibuprofen (ADVIL,MOTRIN) 200 MG tablet Take 800 mg by mouth every 6 (six) hours as needed for headache or moderate pain.    [provider]  ondansetron (ZOFRAN ODT) 4 MG disintegrating tablet Take 1 tablet (4 mg total) by mouth every 8 (eight) hours as needed for nausea or vomiting. 12/15/16   Fransico Meadow, PA-C      Allergies    Patient has no known allergies.    Review of Systems   Review of Systems  Constitutional:  Negative for chills, fatigue and fever.  HENT:  Negative for congestion.    Eyes:  Positive for pain and redness. Negative for blurred vision, double vision, photophobia, discharge, itching and visual disturbance.  Respiratory:  Negative for cough, chest tightness, shortness of breath and wheezing.   Cardiovascular:  Negative for chest pain and palpitations.  Gastrointestinal:  Negative for abdominal pain, constipation, diarrhea, nausea and vomiting.  Genitourinary:  Negative for flank pain, frequency, penile discharge, penile pain and penile swelling.  Skin:  Positive for rash (on penis).  Neurological:  Negative for weakness, light-headedness, numbness and headaches.  Psychiatric/Behavioral:  Negative for agitation.   All other systems reviewed and are negative.   Physical Exam Updated Vital Signs Ht 5\' 7"  (1.702 m)   Wt 77.1 kg   BMI 26.63 kg/m  Physical Exam Vitals and nursing note reviewed. Exam conducted with a chaperone present.  Constitutional:      General: He is not in acute distress.    Appearance: He is well-developed. He is not ill-appearing, toxic-appearing or diaphoretic.  HENT:     Head: Atraumatic.     Right Ear: External ear normal.     Left Ear: External ear normal.     Nose: Nose normal.     Mouth/Throat:     Pharynx: No oropharyngeal exudate.  Eyes:     Extraocular Movements: Extraocular movements intact.     Right eye: Normal extraocular motion and no nystagmus.     Conjunctiva/sclera:     Right eye: Right conjunctiva is  not injected. No chemosis or hemorrhage.    Pupils: Pupils are equal, round, and reactive to light. Pupils are equal.     Right eye: Pupil is round, reactive and not sluggish. Corneal abrasion and fluorescein uptake present. Seidel exam negative.     Slit lamp exam:    Right eye: Corneal ulcer present.      Comments: Evidence of corneal ulcer on fluorescein exam.  Pupils round and reactive with no evidence of hyphema.  No dendrites seen on initial exam.  Normal extraocular movements.  Pulmonary:     Effort: No  respiratory distress.     Breath sounds: No stridor.  Abdominal:     Palpations: Abdomen is soft.     Tenderness: There is no abdominal tenderness. There is no guarding or rebound.     Hernia: There is no hernia in the left inguinal area or right inguinal area.  Genitourinary:    Pubic Area: Rash present.     Penis: Circumcised. Lesions present. No tenderness.      Testes: Normal.        Right: Tenderness not present.        Left: Tenderness not present.     Epididymis:     Right: No tenderness.     Left: No tenderness.    Musculoskeletal:        General: No tenderness.     Cervical back: Normal range of motion and neck supple. No tenderness.  Lymphadenopathy:     Lower Body: No right inguinal adenopathy. No left inguinal adenopathy.  Skin:    General: Skin is warm.     Findings: Rash present. No erythema.  Neurological:     Mental Status: He is alert and oriented to person, place, and time.     Cranial Nerves: No cranial nerve deficit.     Motor: No abnormal muscle tone.     Coordination: Coordination normal.     Deep Tendon Reflexes: Reflexes normal.     ED Results / Procedures / Treatments   Labs (all labs ordered are listed, but only abnormal results are displayed) Labs Reviewed - No data to display  EKG None  Radiology No results found.  Procedures Procedures    Medications Ordered in ED Medications  fluorescein ophthalmic strip 1 strip (1 strip Right Eye Given by Other 01/31/22 2322)  tetracaine (PONTOCAINE) 0.5 % ophthalmic solution 2 drop (2 drops Right Eye Given by Other 01/31/22 2322)    ED Course/ Medical Decision Making/ A&P                           Medical Decision Making Risk Prescription drug management.    Jeff Manning is a 36 y.o. male with no significant past medical history who presents with right pain and penile rash.  According to patient, for the last 2 days he has had pain and redness in his right with some discharge.  He also  reports he has had rash in his groin for the last few weeks.  He reports that he has intercourse with a single partner and there is no concern for new STI exposure to his knowledge.  He denies fevers, chills, chest pain, shortness breath, nausea, vomiting, constipation, diarrhea,.  Denies any dysuria or pain with urination.  Denies any trauma.  Denies testicle or scrotum pains.  He reports his eye pain is moderate to severe but he denies any vision changes itself.  Denies diplopia  or blurry vision unless his eye is watering.  He denies any rash on his face or tip of the nose.  No rash on the ear.  No history of shingles reported.  On my exam, patient had a fluorescein evaluation and does appear to have evidence of a corneal ulcer in the right central right eye.  There were no dendrites on my evaluation and after fluorescein and tetracaine his pain completely resolved.  Pupils are symmetric and reactive with normal extraocular movements.  No vision abnormalities reported.  Rest of exam he had clear breath sounds and nontender chest or abdomen.  A chaperone was present and his groin was evaluated.  Patient does have what appears to be herpetic appearing rash to the tip of his penis on the glans and the distal shaft.  He reports is nonpainful and there are no open wounds available for evaluation with a swab or culture.  Denies any tenderness on scrotal exam and no hernias palpated.  Clinically I do suspect a corneal ulcer however we will call ophthalmology discussed plan.  I spoke with Dr. Carolynn Sayers with ophthalmology who recommends prescription for ofloxacin drops and follow-up in clinic tomorrow.  When we did the GU exam and saw the evidence of suspected herpetic rash, he did agree with prescription for the valacyclovir to help Manning if this is a ophthalmologic infection that could have been self inoculated from handling his penis.  We will have him follow-up with ophthalmology and we instructed him to follow-up  with a PCP.  Patient agrees with plan of care and was given prescription for pain medicine as well.  He will follow-up with ophthalmology tomorrow.  He no other questions or concerns and patient was discharged in good condition.            Final Clinical Impression(s) / ED Diagnoses Final diagnoses:  Corneal ulcer of right eye  Penile rash    Rx / DC Orders ED Discharge Orders          Ordered    ofloxacin (OCUFLOX) 0.3 % ophthalmic solution  4 times daily        01/31/22 2352    valACYclovir (VALTREX) 1000 MG tablet  3 times daily        01/31/22 2352    oxyCODONE-acetaminophen (PERCOCET/ROXICET) 5-325 MG tablet  Every 4 hours PRN        01/31/22 2352            Clinical Impression: 1. Corneal ulcer of right eye   2. Penile rash     Disposition: Discharge  Condition: Good  I have discussed the results, Dx and Tx plan with the pt(& family if present). He/she/they expressed understanding and agree(s) with the plan. Discharge instructions discussed at great length. Strict return precautions discussed and pt &/or family have verbalized understanding of the instructions. No further questions at time of discharge.    Discharge Medication List as of 01/31/2022 11:54 PM     START taking these medications   Details  ofloxacin (OCUFLOX) 0.3 % ophthalmic solution Place 1 drop into the right eye 4 (four) times daily., Starting Mon 01/31/2022, Normal    oxyCODONE-acetaminophen (PERCOCET/ROXICET) 5-325 MG tablet Take 1 tablet by mouth every 4 (four) hours as needed for severe pain., Starting Mon 01/31/2022, Normal    valACYclovir (VALTREX) 1000 MG tablet Take 1 tablet (1,000 mg total) by mouth 3 (three) times daily., Starting Mon 01/31/2022, Normal        Follow Up: [place],  [address], [address] [place] [address] [phone]  Go to  tomorrow  [facility] [id number] [place]  [place]        Dellene Mcgroarty, Gwenyth Allegra, MD 02/01/22 [phone]

## 2022-01-31 NOTE — Discharge Instructions (Signed)
Your history, exam and evaluation today are concerning for 2 separate things including the corneal ulcer causing the eye pain and redness and the groin rash appears to look like a herpetic infection.  I spoke again with ophthalmology who agreed with the antibiotic drops for the eyes but also starting on the antiviral medication to help with both areas of rash.  They would like to see you in clinic tomorrow.  Please rest and stay hydrated and use the pain medicine to help with discomfort.  If any symptoms change or worsen acutely, please return to the nearest emergency department.

## 2022-01-31 NOTE — ED Notes (Addendum)
Pt received 500 cc of NS In right eye via Gaylyn Cheers. Tolerated well. Sx remain after intervention.

## 2022-01-31 NOTE — ED Provider Notes (Incomplete)
  [facility] Provider Note   CSN: [id number] Arrival date & time: 01/31/22  2033     History {Add pertinent medical, surgical, social history, OB history to HPI:1} Chief Complaint  Patient presents with  . Eye Problem    Jeff Manning is a 36 y.o. male.   Eye Problem      Home Medications Prior to Admission medications   Medication Sig Start Date End Date Taking? Authorizing Provider  doxycycline (VIBRAMYCIN) 100 MG capsule Take 1 capsule (100 mg total) by mouth 2 (two) times daily. One po bid x 7 days 08/22/20   Malvin Johns, MD  HYDROcodone-acetaminophen [facility]) 5-325 MG tablet 1-2 tabs po q6 hours prn pain 03/25/16   Leanora Cover, MD  ibuprofen (ADVIL,MOTRIN) 200 MG tablet Take 800 mg by mouth every 6 (six) hours as needed for headache or moderate pain.    [provider]  ondansetron (ZOFRAN ODT) 4 MG disintegrating tablet Take 1 tablet (4 mg total) by mouth every 8 (eight) hours as needed for nausea or vomiting. 12/15/16   Fransico Meadow, PA-C      Allergies    Patient has no known allergies.    Review of Systems   Review of Systems  Physical Exam Updated Vital Signs Ht 5\' 7"  (1.702 m)   Wt 77.1 kg   BMI 26.63 kg/m  Physical Exam  ED Results / Procedures / Treatments   Labs (all labs ordered are listed, but only abnormal results are displayed) Labs Reviewed - No data to display  EKG None  Radiology No results found.  Procedures Procedures  {Document cardiac monitor, telemetry assessment procedure when appropriate:1}  Medications Ordered in ED Medications  fluorescein ophthalmic strip 1 strip (has no administration in time range)  tetracaine (PONTOCAINE) 0.5 % ophthalmic solution 2 drop (has no administration in time range)    ED Course/ Medical Decision Making/ A&P                           Medical Decision Making Risk Prescription drug management.   ***  {Document critical care time when  appropriate:1} {Document review of labs and clinical decision tools ie heart score, Chads2Vasc2 etc:1}  {Document your independent review of radiology images, and any outside records:1} {Document your discussion with family members, caretakers, and with consultants:1} {Document social determinants of health affecting pt's care:1} {Document your decision making why or why not admission, treatments were needed:1} Final Clinical Impression(s) / ED Diagnoses Final diagnoses:  None    Rx / DC Orders ED Discharge Orders     None

## 2022-01-31 NOTE — ED Triage Notes (Signed)
Pt c/o right eye irritation since 0430 this morning. States he woke up with sx. Eye is red and has drainage. Pt states he thinks there is something in it. LWBS earlier today.

## 2022-04-13 ENCOUNTER — Other Ambulatory Visit: Payer: Self-pay

## 2022-04-13 ENCOUNTER — Emergency Department (HOSPITAL_BASED_OUTPATIENT_CLINIC_OR_DEPARTMENT_OTHER)
Admission: EM | Admit: 2022-04-13 | Discharge: 2022-04-13 | Disposition: A | Discharge status: Home / Self Care | Payer: Self-pay | Attending: Emergency Medicine | Admitting: Emergency Medicine

## 2022-04-13 ENCOUNTER — Encounter (HOSPITAL_BASED_OUTPATIENT_CLINIC_OR_DEPARTMENT_OTHER): Payer: Self-pay | Admitting: Emergency Medicine

## 2022-04-13 DIAGNOSIS — J101 Influenza due to other identified influenza virus with other respiratory manifestations: Secondary | ICD-10-CM | POA: Insufficient documentation

## 2022-04-13 DIAGNOSIS — R519 Headache, unspecified: Secondary | ICD-10-CM

## 2022-04-13 DIAGNOSIS — Z1152 Encounter for screening for COVID-19: Secondary | ICD-10-CM | POA: Insufficient documentation

## 2022-04-13 LAB — RESP PANEL BY RT-PCR (RSV, FLU A&B, COVID)  RVPGX2
Influenza A by PCR: NEGATIVE
Influenza B by PCR: POSITIVE — AB
Resp Syncytial Virus by PCR: NEGATIVE
SARS Coronavirus 2 by RT PCR: NEGATIVE

## 2022-04-13 MED ORDER — METOCLOPRAMIDE HCL 10 MG PO TABS
10.0000 mg | ORAL_TABLET | Freq: Once | ORAL | Status: AC
  Administered 2022-04-13: 10 mg via ORAL
  Filled 2022-04-13: qty 1

## 2022-04-13 MED ORDER — BENZONATATE 100 MG PO CAPS: 100.0000 mg | ORAL_CAPSULE | Freq: Three times a day (TID) | ORAL | 0 refills | Status: AC

## 2022-04-13 MED ORDER — OSELTAMIVIR PHOSPHATE 75 MG PO CAPS: 75.0000 mg | ORAL_CAPSULE | Freq: Two times a day (BID) | ORAL | 0 refills | Status: AC

## 2022-04-13 MED ORDER — KETOROLAC TROMETHAMINE 15 MG/ML IJ SOLN
15.0000 mg | Freq: Once | INTRAMUSCULAR | Status: AC
  Administered 2022-04-13: 15 mg via INTRAMUSCULAR
  Filled 2022-04-13: qty 1

## 2022-04-13 NOTE — ED Triage Notes (Signed)
Pt reports HA and head cold x 3d

## 2022-04-13 NOTE — ED Provider Notes (Signed)
[facility] Provider Note   CSN: [id number] Arrival date & time: 04/13/22  2157     History  Chief Complaint  Patient presents with   Headache    Jeff Manning is a 37 y.o. male who presents emergency department with concern for headache x 2 days.  Has sick contacts at home.  His headache is localized to his bilateral temporal regions. Has associated cough, sore throat, rhinorrhea, nasal congestion.  Has tried Percocet to help with his headache. Denies photophobia, diplopia, blurred vision.    The history is provided by the patient. No language interpreter was used.       Home Medications Prior to Admission medications   Medication Sig Start Date End Date Taking? Authorizing Provider  benzonatate (TESSALON) 100 MG capsule Take 1 capsule (100 mg total) by mouth every 8 (eight) hours. 04/13/22  Yes Laterica Matarazzo A, PA-C  doxycycline (VIBRAMYCIN) 100 MG capsule Take 1 capsule (100 mg total) by mouth 2 (two) times daily. One po bid x 7 days 08/22/20   Malvin Johns, MD  HYDROcodone-acetaminophen [facility]) 5-325 MG tablet 1-2 tabs po q6 hours prn pain 03/25/16   Leanora Cover, MD  ibuprofen (ADVIL,MOTRIN) 200 MG tablet Take 800 mg by mouth every 6 (six) hours as needed for headache or moderate pain.    [provider]  ofloxacin (OCUFLOX) 0.3 % ophthalmic solution Place 1 drop into the right eye 4 (four) times daily. 01/31/22   Tegeler, Gwenyth Allegra, MD  ondansetron (ZOFRAN ODT) 4 MG disintegrating tablet Take 1 tablet (4 mg total) by mouth every 8 (eight) hours as needed for nausea or vomiting. 12/15/16   Fransico Meadow, PA-C  oseltamivir (TAMIFLU) 75 MG capsule Take 1 capsule (75 mg total) by mouth every 12 (twelve) hours. 04/13/22  Yes Deitrick Ferreri A, PA-C  oxyCODONE-acetaminophen (PERCOCET/ROXICET) 5-325 MG tablet Take 1 tablet by mouth every 4 (four) hours as needed for severe pain. 01/31/22   Tegeler, Gwenyth Allegra, MD  valACYclovir (VALTREX)  1000 MG tablet Take 1 tablet (1,000 mg total) by mouth 3 (three) times daily. 01/31/22   Tegeler, Gwenyth Allegra, MD      Allergies    Patient has no known allergies.    Review of Systems   Review of Systems  Eyes:  Negative for photophobia and visual disturbance.  Neurological:  Positive for headaches.  All other systems reviewed and are negative.   Physical Exam Updated Vital Signs BP (!) 136/94 (BP Location: Right Arm)   Pulse 84   Temp 97.9 F (36.6 C)   Resp 18   Ht 5\' 6"  (1.676 m)   Wt 77.1 kg   SpO2 96%   BMI 27.44 kg/m  Physical Exam Vitals and nursing note reviewed.  Constitutional:      General: He is not in acute distress.    Appearance: Normal appearance.  HENT:     Mouth/Throat:     Mouth: Mucous membranes are moist.     Pharynx: Oropharynx is clear. Uvula midline. No posterior oropharyngeal erythema or uvula swelling.     Tonsils: No tonsillar exudate or tonsillar abscesses.     Comments: Uvula midline without swelling. No posterior pharyngeal erythema or tonsillar exudate noted. Patent airway. Pt able to speak in clear complete sentences. Tolerating oral secretions. Eyes:     General: No scleral icterus.    Extraocular Movements: Extraocular movements intact.  Cardiovascular:     Rate and Rhythm: Normal rate.  Pulmonary:  Effort: Pulmonary effort is normal. No respiratory distress.  Abdominal:     Palpations: Abdomen is soft. There is no mass.     Tenderness: There is no abdominal tenderness.  Musculoskeletal:        General: Normal range of motion.     Cervical back: Neck supple.  Skin:    General: Skin is warm and dry.     Findings: No rash.  Neurological:     Mental Status: He is alert.     Sensory: Sensation is intact.     Motor: Motor function is intact.     Comments: No focal neurological deficits. Negative pronator drift. Able to ambulate without assistance or difficulty. Strength and sensation intact to BUE and BLE. Grip strength 5/5  bilaterally.  Normal finger-nose testing.  Normal heel-to-shin testing.  Cranial nerves II through XII intact.  Psychiatric:        Behavior: Behavior normal.     ED Results / Procedures / Treatments   Labs (all labs ordered are listed, but only abnormal results are displayed) Labs Reviewed  RESP PANEL BY RT-PCR (RSV, FLU A&B, COVID)  RVPGX2 - Abnormal; Notable for the following components:      Result Value   Influenza B by PCR POSITIVE (*)    All other components within normal limits    EKG None  Radiology No results found.  Procedures Procedures    Medications Ordered in ED Medications  ketorolac (TORADOL) 15 MG/ML injection 15 mg (15 mg Intramuscular Given 04/13/22 2326)  metoCLOPramide (REGLAN) tablet 10 mg (10 mg Oral Given 04/13/22 2325)    ED Course/ Medical Decision Making/ A&P Clinical Course as of 04/13/22 2351  Wed Apr 13, 2022  2247 Influenza B By PCR(!): POSITIVE [SB]  2351 Patient reevaluated and noted improvement of symptoms.  Discussed with patient discharge treatment plan.  Answered all available questions.  Patient appears safe for discharge at this time. [SB]    Clinical Course User Index [SB] Damarkus Balis A, PA-C                             Medical Decision Making Amount and/or Complexity of Data Reviewed Labs:  Decision-making details documented in ED Course.  Risk Prescription drug management.   Pt presents with concerns for headache, cough, sore throat onset 2 days. Sick contacts at home. Vital signs, pt afebrile. On exam, pt with uvula midline without swelling. Able to speak in clear complete sentences. Able to tolerate PO secretions. No posterior pharyngeal erythema or tonsillar exudate noted. No focal neurological deficits noted on exam. Negative pronator drift. No acute cardiovascular, respiratory, abdominal exam findings. Differential diagnosis includes COVID, flu, RSV, strep pharyngitis, viral pharyngitis, viral URI with cough, PNA.     Labs:  I ordered, and personally interpreted labs.  The pertinent results include:   COVID swab negative  Flu swab positive for Flu B RSV swab negative   Medications:  I ordered medication including toradol and reglan for symptom management Reevaluation of the patient after these medicines and interventions, I reevaluated the patient and found that they have improved I have reviewed the patients home medicines and have made adjustments as needed   Disposition: Presentation suspicious for Flu B. Doubt COVID, RSV, Strep pharyngitis, or PNA at this time. Discussed with patient regarding use of Tamiflu and discussed thoroughly on its risks and benefits. Follow discussion, patient opted for supportive care at this time. Work/school note  provided. Discussed with patient that they should not attend work until they are fever free for 24 hours. After consideration of the diagnostic results and the patients response to treatment, I feel that the patient would benefit from Discharge home. Rx for Tamiflu sent. Rx for tessalon perles sent. Work note provided. Supportive care measures and strict return precautions discussed with patient at bedside. Pt acknowledges and verbalizes understanding. Pt appears safe for discharge. Follow up as indicated in discharge paperwork.    This chart was dictated using voice recognition software, Dragon. Despite the best efforts of this provider to proofread and correct errors, errors may still occur which can change documentation meaning.   Final Clinical Impression(s) / ED Diagnoses Final diagnoses:  Bad headache  Influenza B    Rx / DC Orders ED Discharge Orders          Ordered    oseltamivir (TAMIFLU) 75 MG capsule  Every 12 hours        04/13/22 2351    benzonatate (TESSALON) 100 MG capsule  Every 8 hours        04/13/22 2351              Willys Salvino A, PA-C 04/13/22 2351    Charlynne Pander, MD 04/15/22 [phone]

## 2022-04-13 NOTE — Discharge Instructions (Addendum)
It was a pleasure taking care of you today!   Your COVID and RSV swabs were negative today. Your flu swab was positive for Flu B. You will be sent a prescription for tamiflu and tessalon perles, take as directed. You may continue with over the counter cough and cold medications. Ensure to maintain fluid intake. Follow up with your primary care provider regarding todays ED visit. Ensure that you are wearing your mask and practicing good hand hygiene. Return to the ED if you are experiencing increasing/worsening symptoms.
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
## 2022-09-16 ENCOUNTER — Other Ambulatory Visit (HOSPITAL_BASED_OUTPATIENT_CLINIC_OR_DEPARTMENT_OTHER): Payer: Self-pay

## 2022-09-16 MED ORDER — VALACYCLOVIR HCL 500 MG PO TABS
ORAL_TABLET | ORAL | 1 refills | Status: AC
  Filled 2022-09-16: qty 30, 30d supply, fill #0
  Filled 2022-11-22: qty 30, 30d supply, fill #1
  Filled 2023-01-10: qty 30, 30d supply, fill #2
  Filled 2023-03-11: qty 30, 30d supply, fill #3
  Filled 2023-05-25: qty 30, 30d supply, fill #4
  Filled 2023-07-10: qty 30, 30d supply, fill #5

## 2022-09-16 MED ORDER — VALACYCLOVIR HCL 1 G PO TABS
1000.0000 mg | ORAL_TABLET | Freq: Every day | ORAL | 0 refills | Status: AC
  Filled 2022-09-16: qty 10, 10d supply, fill #0

## 2022-11-22 ENCOUNTER — Other Ambulatory Visit (HOSPITAL_BASED_OUTPATIENT_CLINIC_OR_DEPARTMENT_OTHER): Payer: Self-pay

## 2023-01-10 ENCOUNTER — Other Ambulatory Visit (HOSPITAL_BASED_OUTPATIENT_CLINIC_OR_DEPARTMENT_OTHER): Payer: Self-pay

## 2023-03-11 ENCOUNTER — Other Ambulatory Visit: Payer: Self-pay

## 2023-03-21 ENCOUNTER — Other Ambulatory Visit (HOSPITAL_BASED_OUTPATIENT_CLINIC_OR_DEPARTMENT_OTHER): Payer: Self-pay

## 2023-05-25 ENCOUNTER — Other Ambulatory Visit (HOSPITAL_BASED_OUTPATIENT_CLINIC_OR_DEPARTMENT_OTHER): Payer: Self-pay

## 2023-07-10 ENCOUNTER — Other Ambulatory Visit (HOSPITAL_BASED_OUTPATIENT_CLINIC_OR_DEPARTMENT_OTHER): Payer: Self-pay

## 2023-08-06 ENCOUNTER — Encounter (HOSPITAL_BASED_OUTPATIENT_CLINIC_OR_DEPARTMENT_OTHER): Payer: Self-pay

## 2023-08-06 ENCOUNTER — Emergency Department (HOSPITAL_BASED_OUTPATIENT_CLINIC_OR_DEPARTMENT_OTHER)
Admission: EM | Admit: 2023-08-06 | Discharge: 2023-08-06 | Disposition: A | Discharge status: Home / Self Care | Payer: Self-pay | Attending: Emergency Medicine | Admitting: Emergency Medicine

## 2023-08-06 DIAGNOSIS — X58XXXA Exposure to other specified factors, initial encounter: Secondary | ICD-10-CM | POA: Insufficient documentation

## 2023-08-06 DIAGNOSIS — Z87891 Personal history of nicotine dependence: Secondary | ICD-10-CM | POA: Insufficient documentation

## 2023-08-06 DIAGNOSIS — S0502XA Injury of conjunctiva and corneal abrasion without foreign body, left eye, initial encounter: Secondary | ICD-10-CM | POA: Insufficient documentation

## 2023-08-06 MED ORDER — ERYTHROMYCIN 5 MG/GM OP OINT: 1.0000 | TOPICAL_OINTMENT | Freq: Four times a day (QID) | OPHTHALMIC | 0 refills | Status: AC

## 2023-08-06 MED ORDER — TETRACAINE HCL 0.5 % OP SOLN
2.0000 [drp] | Freq: Once | OPHTHALMIC | Status: AC
  Administered 2023-08-06: 2 [drp] via OPHTHALMIC
  Filled 2023-08-06: qty 4

## 2023-08-06 MED ORDER — FLUORESCEIN SODIUM 1 MG OP STRP
1.0000 | ORAL_STRIP | Freq: Once | OPHTHALMIC | Status: AC
  Administered 2023-08-06: 1 via OPHTHALMIC
  Filled 2023-08-06: qty 1

## 2023-08-06 NOTE — Discharge Instructions (Addendum)
 You were evaluated in the Emergency Department and after careful evaluation, we did not find any emergent condition requiring admission or further testing in the hospital.  Your exam/testing today was overall reassuring.  Symptoms seem to be due to a corneal abrasion.  Use the erythromycin ointment as directed to help your eye heal.  Follow-up with an eye doctor as we discussed.  Please return to the Emergency Department if you experience any worsening of your condition.  Thank you for allowing us  to be a part of your care.
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
